# Patient Record
Sex: Male | Born: 2000 | Race: White | Hispanic: No | Marital: Single | Smoking: Never smoker
Health system: Southern US, Community
[De-identification: ages and names within clinical notes are randomized; demographics above are authoritative.]

## PROBLEM LIST (undated history)

## (undated) DIAGNOSIS — F329 Major depressive disorder, single episode, unspecified: Secondary | ICD-10-CM

## (undated) DIAGNOSIS — G4730 Sleep apnea, unspecified: Secondary | ICD-10-CM

## (undated) DIAGNOSIS — F909 Attention-deficit hyperactivity disorder, unspecified type: Secondary | ICD-10-CM

## (undated) DIAGNOSIS — F32A Depression, unspecified: Secondary | ICD-10-CM

## (undated) HISTORY — DX: Depression, unspecified: F32.A

## (undated) HISTORY — PX: TONSILLECTOMY: SUR1361

## (undated) HISTORY — DX: Attention-deficit hyperactivity disorder, unspecified type: F90.9

## (undated) HISTORY — DX: Major depressive disorder, single episode, unspecified: F32.9

## (undated) HISTORY — DX: Sleep apnea, unspecified: G47.30

## (undated) HISTORY — PX: TYMPANOSTOMY TUBE PLACEMENT: SHX32

## (undated) HISTORY — PX: ADENOIDECTOMY: SUR15

---
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
## 2019-01-14 ENCOUNTER — Telehealth: Payer: Self-pay | Admitting: Pediatrics

## 2019-01-15 ENCOUNTER — Telehealth: Payer: Self-pay | Admitting: Pediatrics

## 2021-01-15 ENCOUNTER — Other Ambulatory Visit: Payer: Self-pay

## 2021-01-15 ENCOUNTER — Encounter (HOSPITAL_COMMUNITY): Payer: Self-pay

## 2021-01-15 ENCOUNTER — Emergency Department (HOSPITAL_COMMUNITY): Payer: Self-pay

## 2021-01-15 ENCOUNTER — Emergency Department (HOSPITAL_COMMUNITY)
Admission: EM | Admit: 2021-01-15 | Discharge: 2021-01-15 | Disposition: A | Discharge status: Home / Self Care | Payer: Self-pay | Attending: Emergency Medicine | Admitting: Emergency Medicine

## 2021-01-15 DIAGNOSIS — K6389 Other specified diseases of intestine: Secondary | ICD-10-CM

## 2021-01-15 DIAGNOSIS — K659 Peritonitis, unspecified: Secondary | ICD-10-CM | POA: Insufficient documentation

## 2021-01-15 DIAGNOSIS — R109 Unspecified abdominal pain: Secondary | ICD-10-CM | POA: Diagnosis not present

## 2021-01-15 LAB — COMPREHENSIVE METABOLIC PANEL
ALT: 42 U/L (ref 0–44)
AST: 36 U/L (ref 15–41)
Albumin: 4.5 g/dL (ref 3.5–5.0)
Alkaline Phosphatase: 89 U/L (ref 38–126)
Anion gap: 12 (ref 5–15)
BUN: 12 mg/dL (ref 6–20)
CO2: 24 mmol/L (ref 22–32)
Calcium: 9.2 mg/dL (ref 8.9–10.3)
Chloride: 101 mmol/L (ref 98–111)
Creatinine, Ser: 1.06 mg/dL (ref 0.61–1.24)
GFR, Estimated: >60 mL/min (ref >60)
Glucose, Bld: 90 mg/dL (ref 70–99)
Potassium: 3.6 mmol/L (ref 3.5–5.1)
Sodium: 137 mmol/L (ref 135–145)
Total Bilirubin: 0.7 mg/dL (ref 0.3–1.2)
Total Protein: 8.3 g/dL — ABNORMAL HIGH (ref 6.5–8.1)

## 2021-01-15 LAB — CBC
HCT: 46.1 % (ref 39.0–52.0)
Hemoglobin: 15.5 g/dL (ref 13.0–17.0)
MCH: 28.8 pg (ref 26.0–34.0)
MCHC: 33.6 g/dL (ref 30.0–36.0)
MCV: 85.5 fL (ref 80.0–100.0)
Platelets: 312 10*3/uL (ref 150–400)
RBC: 5.39 MIL/uL (ref 4.22–5.81)
RDW: 12.9 % (ref 11.5–15.5)
WBC: 10.8 10*3/uL — ABNORMAL HIGH (ref 4.0–10.5)
nRBC: 0 % (ref 0.0–0.2)

## 2021-01-15 LAB — URINALYSIS, ROUTINE W REFLEX MICROSCOPIC
Bilirubin Urine: NEGATIVE
Glucose, UA: NEGATIVE mg/dL
Hgb urine dipstick: NEGATIVE
Ketones, ur: NEGATIVE mg/dL
Leukocytes,Ua: NEGATIVE
Nitrite: NEGATIVE
Protein, ur: NEGATIVE mg/dL
Specific Gravity, Urine: 1.017 (ref 1.005–1.030)
pH: 5 (ref 5.0–8.0)

## 2021-01-15 LAB — LIPASE, BLOOD: Lipase: 26 U/L (ref 11–51)

## 2021-01-15 MED ORDER — MORPHINE SULFATE (PF) 4 MG/ML IV SOLN
4.0000 mg | Freq: Once | INTRAVENOUS | Status: AC
  Administered 2021-01-15: 4 mg via INTRAVENOUS
  Filled 2021-01-15: qty 1

## 2021-01-15 MED ORDER — IOHEXOL 300 MG/ML  SOLN
100.0000 mL | Freq: Once | INTRAMUSCULAR | Status: AC | PRN
  Administered 2021-01-15: 100 mL via INTRAVENOUS

## 2021-01-15 MED ORDER — ONDANSETRON HCL 4 MG/2ML IJ SOLN
4.0000 mg | Freq: Once | INTRAMUSCULAR | Status: AC
  Administered 2021-01-15: 4 mg via INTRAVENOUS
  Filled 2021-01-15: qty 2

## 2021-01-15 MED ORDER — SODIUM CHLORIDE 0.9 % IV BOLUS
1000.0000 mL | Freq: Once | INTRAVENOUS | Status: AC
  Administered 2021-01-15: 1000 mL via INTRAVENOUS

## 2021-01-15 MED ORDER — IBUPROFEN 800 MG PO TABS
800.0000 mg | ORAL_TABLET | Freq: Three times a day (TID) | ORAL | 0 refills | Status: DC
Start: 2021-01-15 — End: 2021-11-17

## 2021-01-15 NOTE — Discharge Instructions (Signed)
Your CT scan showed findings consistent with epiploic appendagitis. Please see attached pages for additional information on this condition. Pick up the prescription for Ibuprofen and take as needed every 8 hours for pain. This condition is self limiting and should go away within the next few days.   Please follow up with [facility] for primary care needs  Return to the ED IMMEDIATELY for any new/worsening symptoms including worsening pain, fevers > 100.4, vomiting, or any other concerning symptoms.

## 2021-01-15 NOTE — ED Provider Notes (Signed)
Emergency Medicine Provider Triage Evaluation Note  Robert Cantu , a 20 y.o. male  was evaluated in triage.  Pt complains of gradual onset, constant, worsening, sharp, RLQ abdominal pain for the past 4 days. Pt also complains of nausea and lack of appetite. He mentions that he thought his pain was related to constipation so he took Miralax and Dulcolax and is now having diarrhea. Positive chills; no fever. No previous abdominal surgeries. Last ate around 12-1 PM today. No urinary symptoms.   Review of Systems  Positive: + chills, abdominal pain, nausea, diarrhea Negative: - fevers, urinary symptoms  Physical Exam  BP (!) 143/96 (BP Location: Left Arm)   Pulse (!) 108   Temp 98.4 F (36.9 C) (Oral)   Resp 18   SpO2 98%  Gen:   Awake, no distress   Resp:  Normal effort  MSK:   Moves extremities without difficulty  Other:  + RUQ and RLQ abdominal TTP (worse in the RLQ with positive McBurney's)  Medical Decision Making  Medically screening exam initiated at 6:39 PM.  Appropriate orders placed.  Robert Cantu. was informed that the remainder of the evaluation will be completed by another provider, this initial triage assessment does not replace that evaluation, and the importance of remaining in the ED until their evaluation is complete.  Concern for appendicitis at this time. Work up initiated.    Tanda Rockers, PA-C 01/15/21 1840    Cheryll Cockayne, MD 01/15/21 2231

## 2021-01-15 NOTE — ED Triage Notes (Signed)
Pt reports RLQ x3 days and left shoulder blade pain x1 days. Pt denies N/V/D.

## 2021-01-15 NOTE — ED Provider Notes (Signed)
[facility] Provider Note   CSN: [id number] Arrival date & time: 01/15/21  1817     History Chief Complaint  Patient presents with  . Abdominal Pain    Robert Cantu. is a 20 y.o. male who presents to the ED today with complaint of gradual onset, constant, worsening, RLQ abdominal pain that began 4 days ago. Pt also complains of nausea and lack of appetite however this seems to resolve once he eats. He thought his symptoms were related to constipation and so he took Miralax and dulcolax and is now having diarrhea. Pt also has been having chills, no fever. His mom is a respiratory therapist and was concerned for either gallstones or appendicitis given location of his pain. No previous abdominal surgeries in the past. Denies chest pain, SOB, body aches, testicular pain, testicular swelling, urinary symptoms, or any other associated symptoms.   The history is provided by the patient and medical records.       Past Medical History:  Diagnosis Date  . ADHD (attention deficit hyperactivity disorder)   . Depressed     There are no problems to display for this patient.   Past Surgical History:  Procedure Laterality Date  . ADENOIDECTOMY    . TONSILLECTOMY    . TYMPANOSTOMY TUBE PLACEMENT         Family History  Problem Relation Age of Onset  . Asthma Mother   . Depression Mother   . ADD / ADHD Father   . Hypertension Father     Social History   Tobacco Use  . Smoking status: Never Smoker  . Smokeless tobacco: Never Used  Vaping Use  . Vaping Use: Never used  Substance Use Topics  . Alcohol use: No  . Drug use: No    Home Medications Prior to Admission medications   Medication Sig Start Date End Date Taking? Authorizing Provider  ibuprofen (ADVIL) 800 MG tablet Take 1 tablet (800 mg total) by mouth 3 (three) times daily. 01/15/21  Yes Hyman Hopes, Ilena Dieckman, PA-C  azithromycin (ZITHROMAX Z-PAK) 250 MG tablet Take as directed 06/26/17    Remus Loffler, PA-C  magic mouthwash SOLN Take 5 mLs by mouth 3 (three) times daily as needed for mouth pain. 06/30/17   Mardella Layman, MD    Allergies    Patient has no known allergies.  Review of Systems   Review of Systems  Constitutional: Positive for chills. Negative for fever.  Respiratory: Negative for shortness of breath.   Cardiovascular: Negative for chest pain.  Gastrointestinal: Positive for abdominal pain, diarrhea and nausea. Negative for vomiting.  Genitourinary: Negative for dysuria, frequency and testicular pain.  All other systems reviewed and are negative.   Physical Exam Updated Vital Signs BP 124/68   Pulse 80   Temp 98.4 F (36.9 C) (Oral)   Resp 18   SpO2 96%   Physical Exam Vitals and nursing note reviewed.  Constitutional:      Appearance: He is obese. He is not ill-appearing or diaphoretic.  HENT:     Head: Normocephalic and atraumatic.  Eyes:     Conjunctiva/sclera: Conjunctivae normal.  Cardiovascular:     Rate and Rhythm: Normal rate and regular rhythm.     Heart sounds: Normal heart sounds.  Pulmonary:     Effort: Pulmonary effort is normal.     Breath sounds: Normal breath sounds. No wheezing, rhonchi or rales.  Abdominal:     Palpations: Abdomen is soft.  Tenderness: There is abdominal tenderness in the right upper quadrant and right lower quadrant. There is no right CVA tenderness, left CVA tenderness or guarding. Positive signs include McBurney's sign. Negative signs include Murphy's sign and Rovsing's sign.  Musculoskeletal:     Cervical back: Neck supple.  Skin:    General: Skin is warm and dry.  Neurological:     Mental Status: He is alert.     ED Results / Procedures / Treatments   Labs (all labs ordered are listed, but only abnormal results are displayed) Labs Reviewed  COMPREHENSIVE METABOLIC PANEL - Abnormal; Notable for the following components:      Result Value   Total Protein 8.3 (*)    All other components  within normal limits  CBC - Abnormal; Notable for the following components:   WBC 10.8 (*)    All other components within normal limits  LIPASE, BLOOD  URINALYSIS, ROUTINE W REFLEX MICROSCOPIC    EKG None  Radiology CT Abdomen Pelvis W Contrast  Result Date: 01/15/2021 CLINICAL DATA:  20 year old male with right lower quadrant abdominal pain. EXAM: CT ABDOMEN AND PELVIS WITH CONTRAST TECHNIQUE: Multidetector CT imaging of the abdomen and pelvis was performed using the standard protocol following bolus administration of intravenous contrast. CONTRAST:  OMNIPAQUE IOHEXOL 300 MG/ML  SOLN COMPARISON:  None. FINDINGS: Lower chest: The visualized lung bases are clear. No intra-abdominal free air.  Small free fluid in the pelvis. Hepatobiliary: No focal liver abnormality is seen. No gallstones, gallbladder wall thickening, or biliary dilatation. Pancreas: Unremarkable. No pancreatic ductal dilatation or surrounding inflammatory changes. Spleen: Normal in size without focal abnormality. Adrenals/Urinary Tract: Adrenal glands are unremarkable. Kidneys are normal, without renal calculi, focal lesion, or hydronephrosis. Bladder is unremarkable. Stomach/Bowel: There is no bowel obstruction. The appendix is normal. There is focal inflammatory changes of the fat adjacent to the proximal ascending colon in the right lower quadrant consistent with epiploic appendagitis. No fluid collection or abscess. Vascular/Lymphatic: No significant vascular findings are present. No enlarged abdominal or pelvic lymph nodes. Reproductive: The prostate and seminal vesicles are grossly unremarkable. No pelvic mass. Other: None Musculoskeletal: No acute or significant osseous findings. IMPRESSION: 1. Epiploic appendagitis in the right lower quadrant. No abscess. 2. No bowel obstruction. Normal appendix. Electronically Signed   By: Elgie Collard M.D.   On: 01/15/2021 21:25    Procedures Procedures   Medications Ordered in  ED Medications  sodium chloride 0.9 % bolus 1,000 mL (1,000 mLs Intravenous New Bag/Given 01/15/21 2020)  ondansetron (ZOFRAN) injection 4 mg (4 mg Intravenous Given 01/15/21 2020)  morphine 4 MG/ML injection 4 mg (4 mg Intravenous Given 01/15/21 2020)  iohexol (OMNIPAQUE) 300 MG/ML solution 100 mL (100 mLs Intravenous Contrast Given 01/15/21 2056)    ED Course  I have reviewed the triage vital signs and the nursing notes.  Pertinent labs & imaging results that were available during my care of the patient were reviewed by me and considered in my medical decision making (see chart for details).    MDM Rules/Calculators/A&P                          20 year old male who presents to the ED today with complaint of persistent right lower quadrant abdominal pain for the past 4 days with associated nausea.  Having diarrhea however he thinks it is related to taking MiraLAX and Dulcolax earlier as he thought his symptoms were related to constipation.  No  previous abdominal surgeries.  On arrival to the ED patient is afebrile.  He is mildly tachycardic at 108.  Nontachypneic.  Nontoxic-appearing.  On my exam patient has right upper quadrant and right lower quadrant abdominal tenderness palpation however worse in the right lower quadrant with positive McBurney sign.  Concern for possible appendicitis at this time.  Work-up was initiated by myself in triage with lab work.  CBC with very mild leukocytosis 10,800.  Hemoglobin stable at 15.5.  CMP without electrolyte abnormalities.  Creatinine stable at 1.06.  No LFT elevation.  Lipase 26.  Provided with pain medication, fluids, antiemetics.  Will need CT scan to assess for appendicitis.   CT scan: IMPRESSION:  1. Epiploic appendagitis in the right lower quadrant. No abscess.  2. No bowel obstruction. Normal appendix.   On reevaluation pt resting comfortably. Reports improvement in pain. Tolerating PO here without difficulty. Will discharge home at this time. Pt  instructed on NSAIDs for pain and PCP follow up. Will provide info for [facility] for primary care needs. Pt instructed to return to the ED for any new/worsening symptoms. He is in agreement with plan and stable for discharge home.   This note was prepared using Dragon voice recognition software and may include unintentional dictation errors due to the inherent limitations of voice recognition software.    Final Clinical Impression(s) / ED Diagnoses Final diagnoses:  Epiploic appendagitis    Rx / DC Orders ED Discharge Orders         Ordered    ibuprofen (ADVIL) 800 MG tablet  3 times daily        01/15/21 2139           Discharge Instructions     Your CT scan showed findings consistent with epiploic appendagitis. Please see attached pages for additional information on this condition. Pick up the prescription for Ibuprofen and take as needed every 8 hours for pain. This condition is self limiting and should go away within the next few days.   Please follow up with [facility] for primary care needs  Return to the ED IMMEDIATELY for any new/worsening symptoms including worsening pain, fevers > 100.4, vomiting, or any other concerning symptoms.        Tanda Rockers, PA-C 01/15/21 2141    Cheryll Cockayne, MD 01/15/21 2208

## 2021-11-17 ENCOUNTER — Ambulatory Visit (INDEPENDENT_AMBULATORY_CARE_PROVIDER_SITE_OTHER): Payer: PRIVATE HEALTH INSURANCE | Admitting: Family Medicine

## 2021-11-17 ENCOUNTER — Encounter: Payer: Self-pay | Admitting: Family Medicine

## 2021-11-17 VITALS — BP 152/68 | HR 94 | Temp 98.3°F | Ht 72.0 in | Wt 378.0 lb

## 2021-11-17 DIAGNOSIS — R0683 Snoring: Secondary | ICD-10-CM

## 2021-11-17 DIAGNOSIS — R030 Elevated blood-pressure reading, without diagnosis of hypertension: Secondary | ICD-10-CM

## 2021-11-17 DIAGNOSIS — B372 Candidiasis of skin and nail: Secondary | ICD-10-CM

## 2021-11-17 MED ORDER — FLUCONAZOLE 100 MG PO TABS: ORAL_TABLET | ORAL | 0 refills | Status: DC

## 2021-11-17 NOTE — Progress Notes (Signed)
? ?Subjective:  ?Patient ID: Robert Leavens., male    DOB: 11-Sep-2001  Age: 21 y.o. MRN: [id number] ? ?CC: Establish Care ? ? ?HPI ?Robert Leavens. presents for possible sleep apnea. Snores a lot. Sleep is restful. Not excessively tired through the day. Has gained 160 lb in the last 5 years. Not exercising. Says his diet is okay. BP elevated before. NEver dx with HTN. Has rash left axilla. Treating with OTC antifungals. ? ?Depression screen [facility] 2/9 11/17/2021 06/26/2017  ?Decreased Interest 0 0  ?Down, Depressed, Hopeless 0 0  ?PHQ - 2 Score 0 0  ?Altered sleeping - 0  ?Tired, decreased energy - 0  ?Change in appetite - 0  ?Feeling bad or failure about yourself  - 0  ?Trouble concentrating - 0  ?Moving slowly or fidgety/restless - 0  ?Suicidal thoughts - 0  ?PHQ-9 Score - 0  ? ? ?History ?Robert Cantu has a past medical history of ADHD (attention deficit hyperactivity disorder) and Depressed.  ? ?He has a past surgical history that includes Adenoidectomy; Tonsillectomy; and Tympanostomy tube placement.  ? ?His family history includes ADD / ADHD in his father; Asthma in his mother; Cancer in his mother; Depression in his mother; Diabetes in his father and mother; Heart disease in his father; Hypertension in his father.He reports that he has never smoked. He has never used smokeless tobacco. He reports that he does not drink alcohol and does not use drugs. ? ? ? ?ROS ?Review of Systems  ?Constitutional:  Negative for fever.  ?Respiratory:  Negative for shortness of breath.   ?Cardiovascular:  Negative for chest pain.  ?Musculoskeletal:  Negative for arthralgias.  ?Skin:  Negative for rash.  ? ?Objective:  ?BP (!) 152/68   Pulse 94   Temp 98.3 ?F (36.8 ?C)   Ht 6' (1.829 m)   Wt (!) 378 lb (171.5 kg)   SpO2 97%   BMI 51.27 kg/m?  ? ?BP Readings from Last 3 Encounters:  ?11/17/21 (!) 152/68  ?01/15/21 (!) 132/97  ?06/30/17 125/72 (68 %, Z = 0.47 /  58 %, Z = 0.20)*  ? ?*BP percentiles are based on the 2017 AAP Clinical  Practice Guideline for boys  ? ? ?Wt Readings from Last 3 Encounters:  ?11/17/21 (!) 378 lb (171.5 kg)  ?06/26/17 217 lb 12.8 oz (98.8 kg) (98 %, Z= 2.08)*  ?05/12/15 226 lb (102.5 kg) (>99 %, Z= 2.72)*  ? ?* Growth percentiles are based on CDC (Boys, 2-20 Years) data.  ? ? ? ?Physical Exam ?Constitutional:   ?   General: He is not in acute distress. ?   Appearance: He is well-developed. He is obese.  ?HENT:  ?   Head: Normocephalic and atraumatic.  ?   Right Ear: External ear normal.  ?   Left Ear: External ear normal.  ?   Nose: Nose normal.  ?Eyes:  ?   Conjunctiva/sclera: Conjunctivae normal.  ?   Pupils: Pupils are equal, round, and reactive to light.  ?Cardiovascular:  ?   Rate and Rhythm: Normal rate and regular rhythm.  ?   Heart sounds: Normal heart sounds. No murmur heard. ?Pulmonary:  ?   Effort: Pulmonary effort is normal. No respiratory distress.  ?   Breath sounds: Normal breath sounds. No wheezing or rales.  ?Abdominal:  ?   Palpations: Abdomen is soft.  ?   Tenderness: There is no abdominal tenderness.  ?Musculoskeletal:     ?   General:  Normal range of motion.  ?   Cervical back: Normal range of motion and neck supple.  ?Skin: ?   General: Skin is warm and dry.  ?   Findings: Rash (erythema with satelites, clear center surrounding the entire left axilla.) present.  ?Neurological:  ?   Mental Status: He is alert and oriented to person, place, and time.  ?   Deep Tendon Reflexes: Reflexes are normal and symmetric.  ?Psychiatric:     ?   Behavior: Behavior normal.     ?   Thought Content: Thought content normal.     ?   Judgment: Judgment normal.  ? ? ? ? ?Assessment & Plan:  ? ?[name] was seen today for establish care. ? ?Diagnoses and all orders for this visit: ? ?Elevated blood pressure reading ? ?Morbid obesity (HCC) ? ?Snores ?-     Ambulatory referral to Sleep Studies ? ?Candidal intertrigo ?-     fluconazole (DIFLUCAN) 100 MG tablet; Take two with first dose. Then starting the next day take one  daily until all are taken. ? ? ? ? ? ? ?I have discontinued [name]'s azithromycin, magic mouthwash, and ibuprofen. I am also having him start on fluconazole. ? ?Allergies as of 11/17/2021   ?No Known Allergies ?  ? ?  ?Medication List  ?  ? ?  ? Accurate as of November 17, 2021  1:51 PM. If you have any questions, ask your nurse or doctor.  ?  ?  ? ?  ? ?STOP taking these medications   ? ?azithromycin 250 MG tablet ?Commonly known as: Zithromax Z-Pak ?Stopped by: Mechele Claude, MD ?  ?ibuprofen 800 MG tablet ?Commonly known as: ADVIL ?Stopped by: Mechele Claude, MD ?  ?magic mouthwash Soln ?Stopped by: Mechele Claude, MD ?  ? ?  ? ?TAKE these medications   ? ?fluconazole 100 MG tablet ?Commonly known as: Diflucan ?Take two with first dose. Then starting the next day take one daily until all are taken. ?Started by: Mechele Claude, MD ?  ? ?  ? ? ? ?Follow-up: Return in about 1 month (around 12/18/2021) for blood pressure. ? ?Mechele Claude, M.D. ?

## 2021-11-17 NOTE — Patient Instructions (Signed)
DASH Eating Plan °DASH stands for Dietary Approaches to Stop Hypertension. The DASH eating plan is a healthy eating plan that has been shown to: °Reduce high blood pressure (hypertension). °Reduce your risk for type 2 diabetes, heart disease, and stroke. °Help with weight loss. °What are tips for following this plan? °Reading food labels °Check food labels for the amount of salt (sodium) per serving. Choose foods with less than 5 percent of the Daily Value of sodium. Generally, foods with less than 300 milligrams (mg) of sodium per serving fit into this eating plan. °To find whole grains, look for the word "whole" as the first word in the ingredient list. °Shopping °Buy products labeled as "low-sodium" or "no salt added." °Buy fresh foods. Avoid canned foods and pre-made or frozen meals. °Cooking °Avoid adding salt when cooking. Use salt-free seasonings or herbs instead of table salt or sea salt. Check with your health care provider or pharmacist before using salt substitutes. °Do not fry foods. Cook foods using healthy methods such as baking, boiling, grilling, roasting, and broiling instead. °Cook with heart-healthy oils, such as olive, canola, avocado, soybean, or sunflower oil. °Meal planning ° °Eat a balanced diet that includes: °4 or more servings of fruits and 4 or more servings of vegetables each day. Try to fill one-half of your plate with fruits and vegetables. °6-8 servings of whole grains each day. °Less than 6 oz (170 g) of lean meat, poultry, or fish each day. A 3-oz (85-g) serving of meat is about the same size as a deck of cards. One egg equals 1 oz (28 g). °2-3 servings of low-fat dairy each day. One serving is 1 cup (237 mL). °1 serving of nuts, seeds, or beans 5 times each week. °2-3 servings of heart-healthy fats. Healthy fats called omega-3 fatty acids are found in foods such as walnuts, flaxseeds, fortified milks, and eggs. These fats are also found in cold-water fish, such as sardines, salmon,  and mackerel. °Limit how much you eat of: °Canned or prepackaged foods. °Food that is high in trans fat, such as some fried foods. °Food that is high in saturated fat, such as fatty meat. °Desserts and other sweets, sugary drinks, and other foods with added sugar. °Full-fat dairy products. °Do not salt foods before eating. °Do not eat more than 4 egg yolks a week. °Try to eat at least 2 vegetarian meals a week. °Eat more home-cooked food and less restaurant, buffet, and fast food. °Lifestyle °When eating at a restaurant, ask that your food be prepared with less salt or no salt, if possible. °If you drink alcohol: °Limit how much you use to: °0-1 drink a day for women who are not pregnant. °0-2 drinks a day for men. °Be aware of how much alcohol is in your drink. In the [place] one drink equals one 12 oz bottle of beer (355 mL), one 5 oz glass of wine (148 mL), or one 1½ oz glass of hard liquor (44 mL). °General information °Avoid eating more than 2,300 mg of salt a day. If you have hypertension, you may need to reduce your sodium intake to 1,500 mg a day. °Work with your health care provider to maintain a healthy body weight or to lose weight. Ask what an ideal weight is for you. °Get at least 30 minutes of exercise that causes your heart to beat faster (aerobic exercise) most days of the week. Activities may include walking, swimming, or biking. °Work with your health care provider or dietitian to   adjust your eating plan to your individual calorie needs. °What foods should I eat? °Fruits °All fresh, dried, or frozen fruit. Canned fruit in natural juice (without added sugar). °Vegetables °Fresh or frozen vegetables (raw, steamed, roasted, or grilled). Low-sodium or reduced-sodium tomato and vegetable juice. Low-sodium or reduced-sodium tomato sauce and tomato paste. Low-sodium or reduced-sodium canned vegetables. °Grains °Whole-grain or whole-wheat bread. Whole-grain or whole-wheat pasta. Brown rice. Oatmeal. Quinoa.  Bulgur. Whole-grain and low-sodium cereals. Pita bread. Low-fat, low-sodium crackers. Whole-wheat flour tortillas. °Meats and other proteins °Skinless chicken or turkey. Ground chicken or turkey. Pork with fat trimmed off. Fish and seafood. Egg whites. Dried beans, peas, or lentils. Unsalted nuts, nut butters, and seeds. Unsalted canned beans. Lean cuts of beef with fat trimmed off. Low-sodium, lean precooked or cured meat, such as sausages or meat loaves. °Dairy °Low-fat (1%) or fat-free (skim) milk. Reduced-fat, low-fat, or fat-free cheeses. Nonfat, low-sodium ricotta or cottage cheese. Low-fat or nonfat yogurt. Low-fat, low-sodium cheese. °Fats and oils °Soft margarine without trans fats. Vegetable oil. Reduced-fat, low-fat, or light mayonnaise and salad dressings (reduced-sodium). Canola, safflower, olive, avocado, soybean, and sunflower oils. Avocado. °Seasonings and condiments °Herbs. Spices. Seasoning mixes without salt. °Other foods °Unsalted popcorn and pretzels. Fat-free sweets. °The items listed above may not be a complete list of foods and beverages you can eat. Contact a dietitian for more information. °What foods should I avoid? °Fruits °Canned fruit in a light or heavy syrup. Fried fruit. Fruit in cream or butter sauce. °Vegetables °Creamed or fried vegetables. Vegetables in a cheese sauce. Regular canned vegetables (not low-sodium or reduced-sodium). Regular canned tomato sauce and paste (not low-sodium or reduced-sodium). Regular tomato and vegetable juice (not low-sodium or reduced-sodium). Pickles. Olives. °Grains °Baked goods made with fat, such as croissants, muffins, or some breads. Dry pasta or rice meal packs. °Meats and other proteins °Fatty cuts of meat. Ribs. Fried meat. Bacon. Bologna, salami, and other precooked or cured meats, such as sausages or meat loaves. Fat from the back of a pig (fatback). Bratwurst. Salted nuts and seeds. Canned beans with added salt. Canned or smoked fish.  Whole eggs or egg yolks. Chicken or turkey with skin. °Dairy °Whole or 2% milk, cream, and half-and-half. Whole or full-fat cream cheese. Whole-fat or sweetened yogurt. Full-fat cheese. Nondairy creamers. Whipped toppings. Processed cheese and cheese spreads. °Fats and oils °Butter. Stick margarine. Lard. Shortening. Ghee. Bacon fat. Tropical oils, such as coconut, palm kernel, or palm oil. °Seasonings and condiments °Onion salt, garlic salt, seasoned salt, table salt, and sea salt. Worcestershire sauce. Tartar sauce. Barbecue sauce. Teriyaki sauce. Soy sauce, including reduced-sodium. Steak sauce. Canned and packaged gravies. Fish sauce. Oyster sauce. Cocktail sauce. Store-bought horseradish. Ketchup. Mustard. Meat flavorings and tenderizers. Bouillon cubes. Hot sauces. Pre-made or packaged marinades. Pre-made or packaged taco seasonings. Relishes. Regular salad dressings. °Other foods °Salted popcorn and pretzels. °The items listed above may not be a complete list of foods and beverages you should avoid. Contact a dietitian for more information. °Where to find more information °National Heart, Lung, and Blood Institute: www.nhlbi.nih.gov °American Heart Association: www.heart.org °Academy of Nutrition and Dietetics: www.eatright.org °National Kidney Foundation: www.kidney.org °Summary °The DASH eating plan is a healthy eating plan that has been shown to reduce high blood pressure (hypertension). It may also reduce your risk for type 2 diabetes, heart disease, and stroke. °When on the DASH eating plan, aim to eat more fresh fruits and vegetables, whole grains, lean proteins, low-fat dairy, and heart-healthy fats. °With the DASH   eating plan, you should limit salt (sodium) intake to 2,300 mg a day. If you have hypertension, you may need to reduce your sodium intake to 1,500 mg a day. °Work with your health care provider or dietitian to adjust your eating plan to your individual calorie needs. °This information is not  intended to replace advice given to you by your health care provider. Make sure you discuss any questions you have with your health care provider. °Document Revised: 08/01/2019 Document Reviewed: 08/01/2019 °Elsevier Patient Education © 2022 [employer] Inc. ° °

## 2022-01-02 ENCOUNTER — Encounter: Payer: Self-pay | Admitting: Family Medicine

## 2022-01-02 ENCOUNTER — Ambulatory Visit (INDEPENDENT_AMBULATORY_CARE_PROVIDER_SITE_OTHER): Payer: PRIVATE HEALTH INSURANCE | Admitting: Family Medicine

## 2022-01-02 DIAGNOSIS — R0683 Snoring: Secondary | ICD-10-CM | POA: Diagnosis not present

## 2022-01-02 NOTE — Progress Notes (Signed)
? ?Subjective:  ?Patient ID: Robert Cantu., male    DOB: [birth year]  Age: 21 y.o. MRN: [id number] ? ?CC: No chief complaint on file. ? ? ?HPI ?Robert Cantu. presents for recheck of BP. Adopting a low stress mental outlook Some decrease is salt in the diet. DCed fast food. Concerned about heart health if he remains obese. ? ?Reports he has tried to contact the [facility], but has not gotten an answer. He denies having had any missed calls. He does not use MyChart. I reviewed the referral work and it was noted that pt. Could not be reached.  ? ? ?  01/02/2022  ?  2:58 PM 11/17/2021  ? 10:45 AM 06/26/2017  ?  5:57 PM  ?Depression screen PHQ 2/9  ?Decreased Interest 0 0 0  ?Down, Depressed, Hopeless 0 0 0  ?PHQ - 2 Score 0 0 0  ?Altered sleeping   0  ?Tired, decreased energy   0  ?Change in appetite   0  ?Feeling bad or failure about yourself    0  ?Trouble concentrating   0  ?Moving slowly or fidgety/restless   0  ?Suicidal thoughts   0  ?PHQ-9 Score   0  ? ? ?History ?[name] has a past medical history of ADHD (attention deficit hyperactivity disorder) and Depressed.  ? ?He has a past surgical history that includes Adenoidectomy; Tonsillectomy; and Tympanostomy tube placement.  ? ?His family history includes ADD / ADHD in his father; Asthma in his mother; Cancer in his mother; Depression in his mother; Diabetes in his father and mother; Heart disease in his father; Hypertension in his father.He reports that he has never smoked. He has never used smokeless tobacco. He reports that he does not drink alcohol and does not use drugs. ? ? ? ?ROS ?Review of Systems  ?Constitutional:  Negative for fever.  ?Respiratory:  Negative for shortness of breath.   ?Cardiovascular:  Negative for chest pain.  ?Musculoskeletal:  Negative for arthralgias.  ?Skin:  Negative for rash.  ? ?Objective:  ?BP 130/67   Pulse 83   Temp 98 ?F (36.7 ?C)   Ht 6' (1.829 m)   Wt (!) 367 lb 12.8 oz (166.8 kg)   SpO2 98%   BMI 49.88 kg/m?   ? ?BP Readings from Last 3 Encounters:  ?01/02/22 130/67  ?11/17/21 (!) 152/68  ?01/15/21 (!) 132/97  ? ? ?Wt Readings from Last 3 Encounters:  ?01/02/22 (!) 367 lb 12.8 oz (166.8 kg)  ?11/17/21 (!) 378 lb (171.5 kg)  ?06/26/17 217 lb 12.8 oz (98.8 kg) (98 %, Z= 2.08)*  ? ?* Growth percentiles are based on CDC (Boys, 2-20 Years) data.  ? ? ? ?Physical Exam ?Vitals reviewed.  ?Constitutional:   ?   Appearance: He is well-developed.  ?HENT:  ?   Head: Normocephalic and atraumatic.  ?   Right Ear: External ear normal.  ?   Left Ear: External ear normal.  ?   Mouth/Throat:  ?   Pharynx: No oropharyngeal exudate or posterior oropharyngeal erythema.  ?Eyes:  ?   Pupils: Pupils are equal, round, and reactive to light.  ?Cardiovascular:  ?   Rate and Rhythm: Normal rate and regular rhythm.  ?   Heart sounds: No murmur heard. ?Pulmonary:  ?   Effort: No respiratory distress.  ?   Breath sounds: Normal breath sounds.  ?Musculoskeletal:  ?   Cervical back: Normal range of motion and neck supple.  ?Neurological:  ?  Mental Status: He is alert and oriented to person, place, and time.  ? ? ? ? ?Assessment & Plan:  ? ?Diagnoses and all orders for this visit: ? ?Morbid obesity ([place]) ?-     Amb Ref to Medical Weight Management ? ?Snores ? ? ? ? ? ? ?I have discontinued Lyman Speller Jr.'s fluconazole. ? ?Allergies as of 01/02/2022   ?No Known Allergies ?  ? ?  ?Medication List  ?  ? ?  ? Accurate as of January 02, 2022  4:02 PM. If you have any questions, ask your nurse or doctor.  ?  ?  ? ?  ? ?STOP taking these medications   ? ?fluconazole 100 MG tablet ?Commonly known as: Diflucan ?Stopped by: Claretta Fraise, MD ?  ? ?  ? ? ? ?Follow-up: Return in about 3 months (around 04/03/2022). ? ?Claretta Fraise, M.D. ?

## 2022-03-02 ENCOUNTER — Ambulatory Visit (INDEPENDENT_AMBULATORY_CARE_PROVIDER_SITE_OTHER): Payer: PRIVATE HEALTH INSURANCE | Admitting: Neurology

## 2022-03-02 ENCOUNTER — Encounter: Payer: Self-pay | Admitting: Neurology

## 2022-03-02 VITALS — BP 145/86 | HR 93 | Ht 72.0 in | Wt 369.0 lb

## 2022-03-02 DIAGNOSIS — Z024 Encounter for examination for driving license: Secondary | ICD-10-CM

## 2022-03-02 DIAGNOSIS — Z6841 Body Mass Index (BMI) 40.0 and over, adult: Secondary | ICD-10-CM | POA: Diagnosis not present

## 2022-03-02 DIAGNOSIS — R0683 Snoring: Secondary | ICD-10-CM

## 2022-03-02 DIAGNOSIS — E66813 Obesity, class 3: Secondary | ICD-10-CM

## 2022-03-02 NOTE — Patient Instructions (Signed)
Preventing Consequences of Unhealthy Weight Loss Behaviors, Adult Reaching and maintaining a healthy weight is important for your overall health. A healthy weight will vary from person to person. It is natural to want to lose weight quickly, using whatever methods seem fastest. However, losing weight in a healthy way is not a quick process. Instead, aim for slow, steady weight loss by making small changes and setting achievable goals. How can unhealthy weight loss behaviors affect me? Using unhealthy behaviors to try to lose weight can cause: Tiredness (fatigue), low heart rate, and low blood pressure. Imbalances in your body. These may be imbalances in: Electrolytes. These are salts and minerals in your blood. Chemicals. These are needed so your body can work properly. Body fluids. Loss of fluids may lead to dehydration. Organ damage or organ failure, especially affecting the kidneys. Thin bones that break easily. Loneliness or relationship problems with your friends and family. Emotional problems, including depression and anxiety. Changing unhealthy weight loss behaviors through lifestyle changes will improve your overall health. Maintaining a healthy weight also lowers your risk of certain conditions, such as: Obesity. Heart disease, high cholesterol, and high blood pressure. Type 2 diabetes. Breathing and sleeping disorders. Stroke. Osteoarthritis. This affects your joints. Osteoporosis. This affects your bones. Some cancers. What can increase my risk? Certain views or feelings about yourself and certain habits can increase your risk of unhealthy weight loss behaviors. These include: Having depression and being overweight as an adult. Attempting weight loss as a child or teen. Using alcohol, drugs, or tobacco products. What actions can I take to prevent these behaviors? You can make certain lifestyle changes to help you lose weight in a healthy way. These include eating nutritious  foods and exercising regularly. Nutrition  Eat a variety of healthy foods, including fruits and vegetables, whole grains, lean proteins, and low-fat dairy products. Drink water instead of sugary drinks such as juice, soda, or sports drinks. Drink enough fluid to keep your urine pale yellow. Plan healthy, balanced meals. Work with a dietitian to make a healthy meal plan that works for you. Limit the following: Foods that are high in fat, salt (sodium), or sugar. These include candy, donuts, pizza, and fast foods. Fried or heavily processed foods. Lifestyle Avoid these unhealthy eating habits: Following a diet that restricts entire types of food. This may be a popular diet that promises extreme results in a short time. Skipping meals to save calories. Not eating anything for long periods of time (fasting). Restricting your calories to far fewer than the number that you need to lose or maintain a healthy weight. Taking laxative pills to make you have more frequent bowel movements. Taking medicines to make your body lose excess fluids (diuretics). Eating an excessive amount of food and then making yourself vomit. This is known as bingeing and purging. Do not use any products that contain nicotine or tobacco. These products include cigarettes, chewing tobacco, and vaping devices, such as e-cigarettes. If you need help quitting, ask your health care provider. Alcohol use Do not drink alcohol if: Your health care provider tells you not to drink. You are pregnant, may be pregnant, or are planning to become pregnant. If you drink alcohol: Limit how much you have to: 0-1 drink a day for women. 0-2 drinks a day for men. Know how much alcohol is in your drink. In the [place] one drink equals one 12 oz bottle of beer (355 mL), one 5 oz glass of wine (148 mL), or one 1   oz glass of hard liquor (44 mL). Activity  Avoid compulsively getting an extreme amount of exercise. Work with a dietitian to make a  healthy exercise program. Include different types of exercise in your exercise program, such as strengthening, aerobic, and flexibility exercises. To maintain your weight, get at least 150 minutes of moderate-intensity exercise each week. Moderate-intensity exercise could be brisk walking or biking. To lose a healthy amount of weight, get 60 minutes of moderate-intensity exercise each day. Find ways to reduce stress, such as regular exercise or meditation. Find a hobby or other activity that you enjoy to distract you from eating when you feel stressed or bored. Where to find support For more support, talk with: Your health care provider or dietitian. Ask about support groups. A mental health care provider. Family and friends. Where to find more information Learn more about how to prevent complications from unhealthy weight loss behaviors from: Centers for Disease Control and Prevention: www.cdc.gov National Institute of Mental Health: www.nimh.nih.gov National Eating Disorders Association: www.nationaleatingdisorders.org Contact a health care provider if: You often feel very tired. You notice changes in your skin or your hair. You faint because of dehydration or too much exercise. You struggle to change your unhealthy weight loss behaviors on your own. Unhealthy weight loss behaviors are affecting your daily life or your relationships. You have signs or symptoms of an eating disorder. You have major weight changes in a short period of time. You feel guilty or ashamed about eating or exercising. Summary Using unhealthy eating behaviors to try to lose weight can cause a variety of physical and emotional problems that affect your overall health and well-being. Aim for slow, steady weight loss by choosing healthy foods, avoiding unhealthy eating habits, and exercising regularly. Contact your health care provider if you struggle to change your behaviors on your own or if you think that you may  have an eating disorder. This information is not intended to replace advice given to you by your health care provider. Make sure you discuss any questions you have with your health care provider. Document Revised: 04/12/2021 Document Reviewed: 04/12/2021 Elsevier Patient Education  2023 [employer] Inc.  

## 2022-03-02 NOTE — Progress Notes (Signed)
SLEEP MEDICINE CLINIC    Provider:  Melvyn Novas, MD  Primary Care Physician:  Mechele Claude, MD [address] [place] [address]     Referring Provider: Mechele Claude, Md [address] [place],  [place] [address]          Chief Complaint according to patient   Patient presents with:     New Patient (Initial Visit)     Presents today per DOT recommendation. He wants to get a license. He overall feels fine in regards to his sleep and feels well rested. 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 He has never had a SS. Has been told he snores in sleep.  Avg 8-10 hrs of sleep and wakes up refreshed. No complaints of daytime sleepiness.      HISTORY OF PRESENT ILLNESS:  Robert Cantu. is a 21 y.o.  Caucasian male patient seen here as a referral on 03/02/2022 from PCP for a DOT prep PSG examination. .  Chief concern according to patient :  I need to get a DOT licence.   I have the pleasure of seeing Robert Cantu. today, a right-handed White or Caucasian male with a possible sleep disorder.  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    Sleep relevant medical history: Nocturia/ Enuresis, Tonsillectomy at age 76, sleep talking- enlarged thyroid, LN neck .   Family medical /sleep history: single child, both parents  with OSA. Grandparent with COPD. 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  The patient currently works for 15 day periods, erratic.  Pets are present: 2 dogs, several cats.  Tobacco use: second hand smoke.  ETOH use ; rarely, Caffeine intake in form of Coffee( 4 a day) Soda( 2-3 day) Tea ( /) some energy drinks-  monsters. . Regular exercise in form of work related activity- lifting, outdoors.       Sleep habits are as follows: The patient's dinner time is between 4-6 PM. The patient goes to bed at 10-12 PM and continues to sleep for 6-8 hours, no bathroom breaks.   The preferred sleep position is sideways, with the support of 2 pillows. Dreams are reportedly frequent/vivid.  8  AM is the usual rise time. The patient wakes up with an alarm.  He reports not feeling refreshed or restored in AM, Naps are taken rare - unless having been at work for 12-16 hours. lReview of Systems: Out of a complete 14 system review, the patient complains of only the following symptoms, and all other reviewed systems are negative.:  Fatigue, sleepiness , snoring, unfragmented sleep- in the truck.    How likely are you to doze in the following situations: 0 = not likely, 1 = slight chance, 2 = moderate chance, 3 = high chance   Sitting and Reading? Watching Television? Sitting inactive in a public place (theater or meeting)? As a passenger in a car for an hour without a break? Lying down in the afternoon when circumstances permit? Sitting and talking to someone? Sitting quietly after lunch without alcohol? In a car, while stopped for a few  minutes in traffic?   Total = 3/ 24 points   FSS endorsed at 18/ 63 points.   Social History   Socioeconomic History   Marital status: Single    Spouse name: Not on file   Number of children: Not on file   Years of education: Not on file   Highest education level: 12th grade  Occupational History   Occupation: Works in Theatre stage manager  Tobacco Use   Smoking status: Never   Smokeless tobacco: Never  Vaping Use   Vaping Use: Never used  Substance and Sexual Activity   Alcohol use: No    Comment: rare   Drug use: No   Sexual activity: Never  Other Topics Concern   Not on file  Social History Narrative   Not on file   Social Determinants of Health   Financial Resource  Strain: Not on file  Food Insecurity: Not on file  Transportation Needs: Not on file  Physical Activity: Not on file  Stress: Not on file  Social Connections: Not on file    Family History  Problem Relation Age of Onset   Cancer Mother    Diabetes Mother    Asthma Mother    Depression Mother    Heart disease Father    Diabetes Father    ADD / ADHD Father    Hypertension Father     Past Medical History:  Diagnosis Date   ADHD (attention deficit hyperactivity disorder)    Depressed  Morbid obesity     Past Surgical History:  Procedure Laterality Date   ADENOIDECTOMY     TONSILLECTOMY     TYMPANOSTOMY TUBE PLACEMENT       Current Outpatient Medications on File Prior to Visit  Medication Sig Dispense Refill   amoxicillin (AMOXIL) 500 MG capsule Take 500 mg by mouth 2 (two) times daily.     No current facility-administered medications on file prior to visit.    No Known Allergies  Physical exam:  Today's Vitals   03/02/22 0957  BP: (!) 145/86  Pulse: 93  Weight: (!) 369 lb (167.4 kg)  Height: 6' (1.829 m)   Body mass index is 50.05 kg/m.   Wt Readings from Last 3 Encounters:  03/02/22 (!) 369 lb (167.4 kg)  01/02/22 (!) 367 lb 12.8 oz (166.8 kg)  11/17/21 (!) 378 lb (171.5 kg)     Ht Readings from Last 3 Encounters:  03/02/22 6' (1.829 m)  01/02/22 6' (1.829 m)  11/17/21 6' (1.829 m)      General: The patient is awake, alert and appears not in acute distress. The patient is well groomed. Head: Normocephalic, atraumatic. Neck is supple. Mallampati ,  neck circumference:19 inches . Nasal airflow  patent.  Retrognathia is seen. Overbite- barces in childhood.  Dental status: crowded Cardiovascular:  Regular rate and cardiac rhythm by pulse,  without distended neck veins. Respiratory: Lungs are clear to auscultation.  Skin:  Without evidence of ankle edema, or rash. Trunk: The patient's posture is erect.   Neurologic exam : The patient is awake and  alert, oriented to place and time.   Memory subjective described as intact.  Attention span & concentration ability appears normal.  Speech is fluent,  without  dysarthria, dysphonia or aphasia.  Mood and affect are appropriate.   Cranial nerves: no loss of smell or taste reported  Pupils are equal and briskly reactive to light. Funduscopic exam deferred..  Extraocular movements in vertical and horizontal planes were intact  and without nystagmus. No Diplopia. Visual fields by finger perimetry are intact. Hearing was intact to soft voice and finger rubbing.  Formerly ear tubes.    Facial sensation intact to fine touch.  Facial motor strength is symmetric and tongue and uvula move midline.  Neck ROM : rotation, tilt and flexion extension were normal for age and shoulder shrug was symmetrical.    Motor exam:  Symmetric bulk, tone and ROM.   Normal tone without cog wheeling, symmetric grip strength .   Sensory:  Fine touch and vibration.  Proprioception tested in the upper extremities was normal.   Coordination: Rapid alternating movements in the fingers/hands were of normal speed.   Normal penmanship.  The Finger-to-nose maneuver was intact without evidence of ataxia, dysmetria or tremor.   Gait and station: Patient could rise unassisted from a seated position, walked without assistive device.  Stance is of base .  Toe and heel walk were deferred.  Deep tendon reflexes: in the  upper and lower extremities are symmetric and intact.  Babinski response was deferred /.       After spending a total time of 45 minutes face to face and additional time for physical and neurologic examination, review of laboratory studies,  personal review of imaging studies, reports and results of other testing and review of referral information / records as far as provided in visit, I have established the following assessments:  1)  BMI is a high risk marker, snoring has been observed.  2)  normal upper  airway but large neck, small jaw.  3) restorative sleep, no fatigue.  4) untreated for ADD/ADHD at this time.    My Plan is to proceed with:  1) HST ASAP for DOT, please mark on records.    I would like to thank Mechele Claude, MD and Mechele Claude, Md [address] [place],  [place] [address] for allowing me to meet with and to take care of this pleasant patient.    I plan to follow up either personally or through our NP within 2-3 months.    Electronically signed by: Melvyn Novas, MD 03/02/2022 10:27 AM  [facility] Board certified by The [employer] of Sleep Medicine and Diplomate of the [employer] of Sleep Medicine. Board certified In Neurology through the ABPN, Fellow of the [employer] of Neurology. Medical Director of [facility].

## 2022-03-21 ENCOUNTER — Telehealth: Payer: Self-pay | Admitting: Neurology

## 2022-03-21 NOTE — Telephone Encounter (Signed)
HST- no auth req it is not a covered benefit with his insurnace company, the patient is aware that he will be billed the whole amount. Patient is scheduled at [facility] for 04/17/22 at 11:30 AM. I mailed packet as well to the patient.

## 2022-04-13 ENCOUNTER — Ambulatory Visit (INDEPENDENT_AMBULATORY_CARE_PROVIDER_SITE_OTHER): Payer: PRIVATE HEALTH INSURANCE | Admitting: Family Medicine

## 2022-04-13 ENCOUNTER — Encounter: Payer: Self-pay | Admitting: Family Medicine

## 2022-04-13 MED ORDER — ESCITALOPRAM OXALATE 10 MG PO TABS: 10.0000 mg | ORAL_TABLET | Freq: Every day | ORAL | 1 refills | Status: DC

## 2022-04-13 NOTE — Progress Notes (Signed)
Subjective:  Patient ID: Robert Cantu., male    DOB: 2001/08/10  Age: 21 y.o. MRN: [id number]  CC: Medical Management of Chronic Issues   HPI [name]. presents for weight loss. Trying to lose so he can enter [employer]. 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   Experiencing anxiety and depression sx. His friends are elling him to get help.     04/13/2022    1:46 PM 04/13/2022    1:37 PM 01/02/2022    2:58 PM  Depression screen PHQ 2/9  Decreased Interest 1 0 0  Down, Depressed, Hopeless 2 0 0  PHQ - 2 Score 3 0 0  Altered sleeping 0    Tired, decreased energy 1    Change in appetite 2    Feeling bad or failure about yourself  3    Trouble concentrating 0    Moving slowly or fidgety/restless 0    Suicidal thoughts 0    PHQ-9 Score 9    Difficult doing work/chores Somewhat difficult        04/13/2022    1:46 PM 01/02/2022    2:58 PM  GAD 7 : Generalized Anxiety Score  Nervous, Anxious, on Edge 2 0  Control/stop worrying 1 0  Worry too much - different things 2 0  Trouble relaxing 0 1  Restless 0 0  Easily annoyed or irritable 3 1  Afraid - awful might happen 1 0  Total GAD 7 Score 9 2  Anxiety Difficulty Somewhat difficult Not difficult at all     History Andranik has a past medical history of ADHD (attention deficit hyperactivity disorder) and Depressed.   He has a past surgical history that includes Adenoidectomy; Tonsillectomy; and Tympanostomy tube placement.   His family history includes ADD / ADHD in his father; Asthma in his mother; Cancer in his mother; Depression in his mother; Diabetes in his father and mother; Heart disease in his father; Hypertension in his father.He reports that he has never smoked. He has never used smokeless tobacco. He reports that he does not drink alcohol and does not use drugs.    ROS Review of Systems  Constitutional:  Negative for fever.  Respiratory:  Negative  for shortness of breath.   Cardiovascular:  Negative for chest pain.  Musculoskeletal:  Negative for arthralgias.  Skin:  Negative for rash.    Objective:  BP 127/73   Pulse 61   Temp 98.4 F (36.9 C)   Ht 6' (1.829 m)   Wt (!) 373 lb (169.2 kg)   SpO2 96%   BMI 50.59 kg/m   BP Readings from Last 3 Encounters:  04/13/22 127/73  03/02/22 (!) 145/86  01/02/22 130/67    Wt Readings from Last 3 Encounters:  04/13/22 (!) 373 lb (169.2 kg)  03/02/22 (!) 369 lb (167.4 kg)  01/02/22 (!) 367 lb 12.8 oz (166.8 kg)     Physical Exam Vitals reviewed.  Constitutional:      Appearance: He is well-developed. He is obese.  HENT:     Head: Normocephalic and atraumatic.     Right Ear: External ear normal.     Left Ear: External ear normal.     Mouth/Throat:     Pharynx: No oropharyngeal exudate or posterior oropharyngeal erythema.  Eyes:     Pupils: Pupils are equal, round, and reactive to light.  Cardiovascular:     Rate  and Rhythm: Normal rate and regular rhythm.     Heart sounds: No murmur heard. Pulmonary:     Effort: No respiratory distress.     Breath sounds: Normal breath sounds.  Musculoskeletal:     Cervical back: Normal range of motion and neck supple.  Neurological:     Mental Status: He is alert and oriented to person, place, and time.       Assessment & Plan:   Ardis was seen today for medical management of chronic issues.  Diagnoses and all orders for this visit:  Morbid obesity (HCC)  Other orders -     escitalopram (LEXAPRO) 10 MG tablet; Take 1 tablet (10 mg total) by mouth daily.       I have discontinued [name]'s amoxicillin. I am also having him start on escitalopram.  Allergies as of 04/13/2022   No Known Allergies      Medication List        Accurate as of April 13, 2022 11:59 PM. If you have any questions, ask your nurse or doctor.          STOP taking these medications    amoxicillin 500 MG capsule Commonly  known as: AMOXIL Stopped by: Mechele Claude, MD       TAKE these medications    escitalopram 10 MG tablet Commonly known as: LEXAPRO Take 1 tablet (10 mg total) by mouth daily. Started by: Mechele Claude, MD         Follow-up: No follow-ups on file.  Mechele Claude, M.D.

## 2022-04-14 ENCOUNTER — Encounter: Payer: Self-pay | Admitting: Family Medicine

## 2022-04-17 ENCOUNTER — Ambulatory Visit (INDEPENDENT_AMBULATORY_CARE_PROVIDER_SITE_OTHER): Payer: PRIVATE HEALTH INSURANCE | Admitting: Neurology

## 2022-04-17 DIAGNOSIS — E662 Morbid (severe) obesity with alveolar hypoventilation: Secondary | ICD-10-CM

## 2022-04-17 DIAGNOSIS — G4733 Obstructive sleep apnea (adult) (pediatric): Secondary | ICD-10-CM

## 2022-04-17 DIAGNOSIS — R0683 Snoring: Secondary | ICD-10-CM

## 2022-04-20 NOTE — Progress Notes (Signed)
           [facility]   HOME SLEEP TEST REPORT ( by Watch PAT)   STUDY DATE:  04-20-2022 MRN: [id number]   ORDERING CLINICIAN: Carmen Dohmeier, MD  REFERRING CLINICIAN: Dr. Stacks, MD   CLINICAL INFORMATION/HISTORY:  03-02-2022.patient has many risk factors for OSA but overall feels fine in regards to his sleep and feels well rested.  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 Has been told he snores in sleep.  Avg 8-10 hrs of sleep and wakes up refreshed. No complaints of daytime sleepiness. Has been diagnosed with morbid ( super-) obesity, epiglottitis, tonsillitis (had tonsillectomy), ADHD, Depression.    Epworth sleepiness score endorsed at : 3/24. FSS at 18/53 points.   BMI: 50.2 kg/m   Neck Circumference: 19"   FINDINGS:   Sleep Summary:   Total Recording Time (hours, min):    6 hours 28 minutes of which the total sleep time amounted to 4 hours 37 minutes.  There were 15.5% of REM sleep time.   Respiratory Indices:   Calculated pAHI (per hour): 8.7/h                           REM pAHI:   37.6/h                                              NREM pAHI: 4.4/h                             Positional AHI: The majority of recorded sleep time was in supine position with an AHI of 11.1/h followed by right lateral sleep with an AHI of 3.4/h.  Note  Snoring statistics show a mean volume of 40 dB which is at threshold.  Snoring would have accompanied only 11% of the total sleep time.                                                 Oxygen Saturation Statistics:     O2 Saturation Range (%): Between a nadir at 89 and a maximum of 98% saturation mean oxygen saturation was measured at 94%.                                      O2 Saturation (minutes) <89%: Less than 1 minute         Pulse Rate Statistics:              Pulse Range:   Between 53 and 98 bpm with a mean heart rate of 75 bpm.              IMPRESSION:  This HST confirms the presence of unexpectedly mild  sleep apnea without hypoxemia but strong REM sleep dependence. There is no treatment option such as a dental device or inspire implant for this constellation. Given the Patient's body mass index , this result warrants an in lab titration to positive airway pressure.  The patient is at high risk of obesity hypoventilation, and the fragmented sleep architecture may still   be a result of arousals of hypoventilation. BED 2, please.   Should his insurance not permit such procedure,  I will order an auto titration CPAP device between 5 and 16 cm water pressure with 2 cm expiratory pressure relief, heated humidification and a mask of patient's choice.  In addition I strongly recommend to avoid supine sleep position and to follow with a medically guided weight loss program.   RECOMMENDATION:    INTERPRETING PHYSICIAN:   Carmen Dohmeier, MD   Medical Director of [facility].             

## 2022-04-27 DIAGNOSIS — E662 Morbid (severe) obesity with alveolar hypoventilation: Secondary | ICD-10-CM | POA: Insufficient documentation

## 2022-04-27 NOTE — Addendum Note (Signed)
Addended by: Melvyn Novas on: 04/27/2022 06:22 PM   Modules accepted: Orders

## 2022-04-27 NOTE — Procedures (Signed)
[facility] SLEEP TEST REPORT ( by Watch PAT)   STUDY DATE:  04-20-2022 MRN: [id number]   ORDERING CLINICIAN: Melvyn Novas, MD  REFERRING CLINICIAN: Dr. Darlyn Read, MD   CLINICAL INFORMATION/HISTORY:  03-02-2022.patient has many risk factors for OSA but overall feels fine in regards to his sleep and feels well rested.  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 Has been told he snores in sleep.  Avg 8-10 hrs of sleep and wakes up refreshed. No complaints of daytime sleepiness. Has been diagnosed with morbid ( super-) obesity, epiglottitis, tonsillitis (had tonsillectomy), ADHD, Depression.    Epworth sleepiness score endorsed at : 3/24. FSS at 18/53 points.   BMI: 50.2 kg/m   Neck Circumference: 19"   FINDINGS:   Sleep Summary:   Total Recording Time (hours, min):    6 hours 28 minutes of which the total sleep time amounted to 4 hours 37 minutes.  There were 15.5% of REM sleep time.   Respiratory Indices:   Calculated pAHI (per hour): 8.7/h                           REM pAHI:   37.6/h                                              NREM pAHI: 4.4/h                             Positional AHI: The majority of recorded sleep time was in supine position with an AHI of 11.1/h followed by right lateral sleep with an AHI of 3.4/h.  Note  Snoring statistics show a mean volume of 40 dB which is at threshold.  Snoring would have accompanied only 11% of the total sleep time.                                                 Oxygen Saturation Statistics:     O2 Saturation Range (%): Between a nadir at 89 and a maximum of 98% saturation mean oxygen saturation was measured at 94%.                                      O2 Saturation (minutes) <89%: Less than 1 minute         Pulse Rate Statistics:              Pulse Range:   Between 53 and 98 bpm with a mean heart rate of 75 bpm.              IMPRESSION:  This HST confirms the presence of unexpectedly mild  sleep apnea without hypoxemia but strong REM sleep dependence. There is no treatment option such as a dental device or inspire implant for this constellation. Given the Patient's body mass index , this result warrants an in lab titration to positive airway pressure.  The patient is at high risk of obesity hypoventilation, and the fragmented sleep architecture may still  be a result of arousals of hypoventilation. BED 2, please.   Should his insurance not permit such procedure,  I will order an auto titration CPAP device between 5 and 16 cm water pressure with 2 cm expiratory pressure relief, heated humidification and a mask of patient's choice.  In addition I strongly recommend to avoid supine sleep position and to follow with a medically guided weight loss program.   RECOMMENDATION:    INTERPRETING PHYSICIAN:   Melvyn Novas, MD   Medical Director of [facility].

## 2022-05-03 ENCOUNTER — Telehealth: Payer: Self-pay | Admitting: Neurology

## 2022-05-03 NOTE — Telephone Encounter (Signed)
Called the patient back. There was no answer. Lvm again for the pt to call back

## 2022-05-03 NOTE — Telephone Encounter (Signed)
-----   Message from Melvyn Novas, MD sent at 04/27/2022  6:22 PM EDT ----- IMPRESSION:  This HST confirms the presence of unexpectedly mild sleep apnea without hypoxemia but strong REM sleep dependence. There is no treatment option such as a dental device or inspire implant for this constellation. Given the Patient's body mass index , this result warrants an in lab titration to positive airway pressure.  The patient is at high risk of obesity hypoventilation, and the fragmented sleep architecture may still be a result of arousals of hypoventilation. BED 2, please.   Should his insurance not permit such procedure,  I will order an auto titration CPAP device between 5 and 16 cm water pressure with 2 cm expiratory pressure relief, heated humidification and a mask of patient's choice.  In addition I strongly recommend to avoid supine sleep position and to follow with a medically guided weight loss program.

## 2022-05-03 NOTE — Telephone Encounter (Signed)
Pt called back and requested a nurse give him a return call.

## 2022-05-03 NOTE — Telephone Encounter (Signed)
Called patient to discuss sleep study results. No answer at this time. LVM for the patient to call back.   

## 2022-05-08 NOTE — Telephone Encounter (Signed)
Noted, thank you

## 2022-05-08 NOTE — Telephone Encounter (Signed)
Took call from phone staff and spoke w/ pt about results. He is going to talk with [employer] and will call back if he wants to proceed with CPAP titration.

## 2022-05-25 ENCOUNTER — Ambulatory Visit: Payer: PRIVATE HEALTH INSURANCE | Admitting: Family Medicine

## 2022-06-15 ENCOUNTER — Encounter: Payer: Self-pay | Admitting: Family Medicine

## 2022-06-15 ENCOUNTER — Ambulatory Visit (INDEPENDENT_AMBULATORY_CARE_PROVIDER_SITE_OTHER): Payer: PRIVATE HEALTH INSURANCE | Admitting: Family Medicine

## 2022-06-15 DIAGNOSIS — F418 Other specified anxiety disorders: Secondary | ICD-10-CM | POA: Diagnosis not present

## 2022-06-15 MED ORDER — ESCITALOPRAM OXALATE 20 MG PO TABS: 20.0000 mg | ORAL_TABLET | Freq: Every day | ORAL | 1 refills | Status: DC

## 2022-06-15 NOTE — Progress Notes (Signed)
Subjective:  Patient ID: Robert Santa., male    DOB: 10/23/2000  Age: 21 y.o. MRN: [id number]  CC: Follow-up (Depression/anxiety)   HPI Robert Santa. presents for feeling some improvement in sx but still some sx as noted below. Mostly still easily irritated and worries a lot.   Continues efforts to lose weight but not losing. Needs help.       06/15/2022    1:02 PM 04/13/2022    1:46 PM 04/13/2022    1:37 PM  Depression screen PHQ 2/9  Decreased Interest 0 1 0  Down, Depressed, Hopeless 1 2 0  PHQ - 2 Score 1 3 0  Altered sleeping 1 0   Tired, decreased energy 0 1   Change in appetite 2 2   Feeling bad or failure about yourself  0 3   Trouble concentrating 0 0   Moving slowly or fidgety/restless 0 0   Suicidal thoughts 0 0   PHQ-9 Score 4 9   Difficult doing work/chores Not difficult at all Somewhat difficult       06/15/2022    1:02 PM 04/13/2022    1:46 PM 01/02/2022    2:58 PM  GAD 7 : Generalized Anxiety Score  Nervous, Anxious, on Edge 1 2 0  Control/stop worrying 0 1 0  Worry too much - different things 2 2 0  Trouble relaxing 0 0 1  Restless 0 0 0  Easily annoyed or irritable 3 3 1   Afraid - awful might happen 1 1 0  Total GAD 7 Score 7 9 2   Anxiety Difficulty Not difficult at all Somewhat difficult Not difficult at all     History Robert Cantu has a past medical history of ADHD (attention deficit hyperactivity disorder), Depressed, and Sleep apnea.   Robert Cantu has a past surgical history that includes Adenoidectomy; Tonsillectomy; and Tympanostomy tube placement.   His family history includes ADD / ADHD in his father; Asthma in his mother; Cancer in his mother; Depression in his mother; Diabetes in his father and mother; Heart disease in his father; Hypertension in his father.Robert Cantu reports that Robert Cantu has never smoked. Robert Cantu has never used smokeless tobacco. Robert Cantu reports that Robert Cantu does not drink alcohol and does not use drugs.    ROS Review of Systems  Constitutional:   Negative for fever.  Respiratory:  Negative for shortness of breath.   Cardiovascular:  Negative for chest pain.  Musculoskeletal:  Negative for arthralgias.  Skin:  Negative for rash.    Objective:  BP (!) 122/57   Pulse 60   Temp 97.9 F (36.6 C)   Ht 6' (1.829 m)   Wt (!) 377 lb 3.2 oz (171.1 kg)   SpO2 95%   BMI 51.16 kg/m   BP Readings from Last 3 Encounters:  06/15/22 (!) 122/57  04/13/22 127/73  03/02/22 (!) 145/86    Wt Readings from Last 3 Encounters:  06/15/22 (!) 377 lb 3.2 oz (171.1 kg)  04/13/22 (!) 373 lb (169.2 kg)  03/02/22 (!) 369 lb (167.4 kg)     Physical Exam Vitals reviewed.  Constitutional:      Appearance: Robert Cantu is well-developed.  HENT:     Head: Normocephalic and atraumatic.     Right Ear: External ear normal.     Left Ear: External ear normal.     Mouth/Throat:     Pharynx: No oropharyngeal exudate or posterior oropharyngeal erythema.  Eyes:     Pupils: Pupils are equal, round,  and reactive to light.  Cardiovascular:     Rate and Rhythm: Normal rate and regular rhythm.     Heart sounds: No murmur heard. Pulmonary:     Effort: No respiratory distress.     Breath sounds: Normal breath sounds.  Musculoskeletal:     Cervical back: Normal range of motion and neck supple.  Neurological:     Mental Status: Robert Cantu is alert and oriented to person, place, and time.       Assessment & Plan:   Robert Cantu was seen today for follow-up.  Diagnoses and all orders for this visit:  Morbid obesity (HCC)  Anxiety with depression  Other orders -     escitalopram (LEXAPRO) 20 MG tablet; Take 1 tablet (20 mg total) by mouth daily.       I have changed Robert Loron Jr.'s escitalopram.  Allergies as of 06/15/2022   No Known Allergies      Medication List        Accurate as of June 15, 2022 11:59 PM. If you have any questions, ask your nurse or doctor.          escitalopram 20 MG tablet Commonly known as: LEXAPRO Take 1 tablet (20  mg total) by mouth daily. What changed:  medication strength how much to take Changed by: Mechele Claude, MD         Follow-up: Return in about 6 weeks (around 07/27/2022) for Depression, Anxiety.  Mechele Claude, M.D.

## 2022-07-27 ENCOUNTER — Ambulatory Visit (INDEPENDENT_AMBULATORY_CARE_PROVIDER_SITE_OTHER): Payer: PRIVATE HEALTH INSURANCE | Admitting: Family Medicine

## 2022-07-27 ENCOUNTER — Encounter: Payer: Self-pay | Admitting: Family Medicine

## 2022-07-27 VITALS — BP 116/48 | HR 70 | Temp 98.1°F | Ht 72.0 in | Wt 376.0 lb

## 2022-07-27 DIAGNOSIS — F418 Other specified anxiety disorders: Secondary | ICD-10-CM | POA: Diagnosis not present

## 2022-07-27 DIAGNOSIS — Z7251 High risk heterosexual behavior: Secondary | ICD-10-CM | POA: Diagnosis not present

## 2022-07-27 NOTE — Progress Notes (Signed)
Subjective:  Patient ID: Robert Leavens., male    DOB: Jun 23, 2001  Age: 21 y.o. MRN: [id number]  CC: Depression and Anxiety   HPI Robert Cantu. presents for recheck of depression See below PHQ. Reviewed with pt. He also has started a new relationship. Both are making sure they are disease free.     07/27/2022    1:58 PM 06/15/2022    1:02 PM 04/13/2022    1:46 PM  Depression screen PHQ 2/9  Decreased Interest 0 0 1  Down, Depressed, Hopeless 0 1 2  PHQ - 2 Score 0 1 3  Altered sleeping 0 1 0  Tired, decreased energy 1 0 1  Change in appetite 2 2 2   Feeling bad or failure about yourself  2 0 3  Trouble concentrating 0 0 0  Moving slowly or fidgety/restless 0 0 0  Suicidal thoughts 0 0 0  PHQ-9 Score 5 4 9   Difficult doing work/chores Not difficult at all Not difficult at all Somewhat difficult    History Robert Cantu has a past medical history of ADHD (attention deficit hyperactivity disorder), Depressed, and Sleep apnea.   He has a past surgical history that includes Adenoidectomy; Tonsillectomy; and Tympanostomy tube placement.   His family history includes ADD / ADHD in his father; Asthma in his mother; Cancer in his mother; Depression in his mother; Diabetes in his father and mother; Heart disease in his father; Hypertension in his father.He reports that he has never smoked. He has never used smokeless tobacco. He reports that he does not drink alcohol and does not use drugs.    ROS Review of Systems  Constitutional:  Negative for fever.  Respiratory:  Negative for shortness of breath.   Cardiovascular:  Negative for chest pain.  Musculoskeletal:  Negative for arthralgias.  Skin:  Negative for rash.  Psychiatric/Behavioral:  Positive for dysphoric mood.     Objective:  BP (!) 116/48   Pulse 70   Temp 98.1 F (36.7 C)   Ht 6' (1.829 m)   Wt (!) 376 lb (170.6 kg)   SpO2 96%   BMI 50.99 kg/m   BP Readings from Last 3 Encounters:  07/27/22 (!) 116/48   06/15/22 (!) 122/57  04/13/22 127/73    Wt Readings from Last 3 Encounters:  07/27/22 (!) 376 lb (170.6 kg)  06/15/22 (!) 377 lb 3.2 oz (171.1 kg)  04/13/22 (!) 373 lb (169.2 kg)     Physical Exam Vitals reviewed.  Constitutional:      Appearance: He is well-developed.  HENT:     Head: Normocephalic and atraumatic.     Right Ear: External ear normal.     Left Ear: External ear normal.     Mouth/Throat:     Pharynx: No oropharyngeal exudate or posterior oropharyngeal erythema.  Eyes:     Pupils: Pupils are equal, round, and reactive to light.  Cardiovascular:     Rate and Rhythm: Normal rate and regular rhythm.     Heart sounds: No murmur heard. Pulmonary:     Effort: No respiratory distress.     Breath sounds: Normal breath sounds.  Musculoskeletal:     Cervical back: Normal range of motion and neck supple.  Neurological:     Mental Status: He is alert and oriented to person, place, and time.       Assessment & Plan:   Robert Cantu was seen today for depression and anxiety.  Diagnoses and all orders for this  visit:  High risk heterosexual behavior -     HepB+HepC+HIV Panel -     RPR -     GC/Chlamydia probe amp ()not at [facility]       I am having Robert Leavens. maintain his escitalopram.  Allergies as of 07/27/2022   No Known Allergies      Medication List        Accurate as of July 27, 2022  3:05 PM. If you have any questions, ask your nurse or doctor.          escitalopram 20 MG tablet Commonly known as: LEXAPRO Take 1 tablet (20 mg total) by mouth daily.         Follow-up: Return in about 6 weeks (around 09/07/2022).  Mechele Claude, M.D.

## 2022-09-28 ENCOUNTER — Encounter: Payer: Self-pay | Admitting: Family Medicine

## 2022-09-28 ENCOUNTER — Ambulatory Visit (INDEPENDENT_AMBULATORY_CARE_PROVIDER_SITE_OTHER): Payer: PRIVATE HEALTH INSURANCE | Admitting: Family Medicine

## 2022-09-28 VITALS — BP 113/68 | HR 62 | Temp 98.2°F | Ht 72.0 in | Wt 362.0 lb

## 2022-09-28 DIAGNOSIS — Z7251 High risk heterosexual behavior: Secondary | ICD-10-CM | POA: Diagnosis not present

## 2022-09-28 DIAGNOSIS — F418 Other specified anxiety disorders: Secondary | ICD-10-CM

## 2022-09-28 MED ORDER — SERTRALINE HCL 100 MG PO TABS: 100.0000 mg | ORAL_TABLET | Freq: Every day | ORAL | 5 refills | Status: DC

## 2022-09-28 NOTE — Progress Notes (Signed)
Subjective:  Patient ID: Robert Santa., male    DOB: 2000-09-17  Age: 22 y.o. MRN: [id number]  CC: 6 weeek f/u depression (STI screen)   HPI Robert Cantu. presents for doing well with regard to depression.  Unfortunately Robert Cantu could not afford the S-Citalopram because Robert Cantu lost his job.  Robert Cantu is trying to find perhaps a cheaper antidepressant to take.  Additionally his girlfriend is upset with him because she was tested and found to be positive for herpes simplex virus 1.  Patient is unsure about HSV-2.  Robert Cantu does want a full STI check including HSV 1 and 2.     09/28/2022    3:22 PM 07/27/2022    1:58 PM 06/15/2022    1:02 PM  Depression screen PHQ 2/9  Decreased Interest 0 0 0  Down, Depressed, Hopeless 0 0 1  PHQ - 2 Score 0 0 1  Altered sleeping 0 0 1  Tired, decreased energy 0 1 0  Change in appetite 0 2 2  Feeling bad or failure about yourself  0 2 0  Trouble concentrating 1 0 0  Moving slowly or fidgety/restless 0 0 0  Suicidal thoughts 0 0 0  PHQ-9 Score 1 5 4   Difficult doing work/chores Not difficult at all Not difficult at all Not difficult at all    History Robert Cantu has a past medical history of ADHD (attention deficit hyperactivity disorder), Depressed, and Sleep apnea.   Robert Cantu has a past surgical history that includes Adenoidectomy; Tonsillectomy; and Tympanostomy tube placement.   His family history includes ADD / ADHD in his father; Asthma in his mother; Cancer in his mother; Depression in his mother; Diabetes in his father and mother; Heart disease in his father; Hypertension in his father.Robert Cantu reports that Robert Cantu has never smoked. Robert Cantu has never used smokeless tobacco. Robert Cantu reports that Robert Cantu does not drink alcohol and does not use drugs.    ROS Review of Systems  Objective:  BP 113/68   Pulse 62   Temp 98.2 F (36.8 C)   Ht 6' (1.829 m)   Wt (!) 362 lb (164.2 kg)   SpO2 98%   BMI 49.10 kg/m   BP Readings from Last 3 Encounters:  09/28/22 113/68  07/27/22 (!)  116/48  06/15/22 (!) 122/57    Wt Readings from Last 3 Encounters:  09/28/22 (!) 362 lb (164.2 kg)  07/27/22 (!) 376 lb (170.6 kg)  06/15/22 (!) 377 lb 3.2 oz (171.1 kg)     Physical Exam Vitals reviewed.  Constitutional:      Appearance: Robert Cantu is well-developed.  HENT:     Head: Normocephalic and atraumatic.     Right Ear: External ear normal.     Left Ear: External ear normal.     Mouth/Throat:     Pharynx: No oropharyngeal exudate or posterior oropharyngeal erythema.  Eyes:     Pupils: Pupils are equal, round, and reactive to light.  Cardiovascular:     Rate and Rhythm: Normal rate and regular rhythm.     Heart sounds: No murmur heard. Pulmonary:     Effort: No respiratory distress.     Breath sounds: Normal breath sounds.  Musculoskeletal:     Cervical back: Normal range of motion and neck supple.  Neurological:     Mental Status: Robert Cantu is alert and oriented to person, place, and time.       Assessment & Plan:   Robert Cantu was seen today for 6 weeek  f/u depression.  Diagnoses and all orders for this visit:  High risk heterosexual behavior -     STI Profile  Other orders -     sertraline (ZOLOFT) 100 MG tablet; Take 1 tablet (100 mg total) by mouth daily. -     Interpretation:       I have discontinued Robert Speller Jr.'s escitalopram. I am also having him start on sertraline.  Allergies as of 09/28/2022   No Known Allergies      Medication List        Accurate as of September 28, 2022 11:59 PM. If you have any questions, ask your nurse or doctor.          STOP taking these medications    escitalopram 20 MG tablet Commonly known as: LEXAPRO Stopped by: Claretta Fraise, MD       TAKE these medications    sertraline 100 MG tablet Commonly known as: ZOLOFT Take 1 tablet (100 mg total) by mouth daily. Started by: Claretta Fraise, MD         Follow-up: Return in about 3 months (around 12/28/2022), or if symptoms worsen or fail to  improve.  Claretta Fraise, M.D.

## 2022-09-29 LAB — STI PROFILE
HCV Ab: NONREACTIVE
HIV Screen 4th Generation wRfx: NONREACTIVE
Hep B Core Total Ab: NEGATIVE
Hep B Surface Ab, Qual: NONREACTIVE
Hepatitis B Surface Ag: NEGATIVE
RPR Ser Ql: NONREACTIVE

## 2022-09-29 LAB — HCV INTERPRETATION

## 2022-10-01 ENCOUNTER — Encounter: Payer: Self-pay | Admitting: Family Medicine

## 2022-10-04 ENCOUNTER — Telehealth: Payer: Self-pay | Admitting: Family Medicine

## 2022-10-04 NOTE — Telephone Encounter (Signed)
No StD , but the one he was concerned about isn't back yet

## 2022-10-04 NOTE — Telephone Encounter (Signed)
Patient wants to speak with PCP or nurse regarding test results.

## 2022-10-04 NOTE — Telephone Encounter (Signed)
Patient aware

## 2022-10-04 NOTE — Telephone Encounter (Signed)
Please review and advise.

## 2022-10-06 LAB — HSV-2 AB, IGG: HSV 2 IgG, Type Spec: <0.91 index (ref 0.00–0.90)

## 2022-10-06 LAB — HSV DNA BY PCR (REFERENCE LAB)
HSV 2 DNA: NEGATIVE
HSV-1 DNA: NEGATIVE

## 2022-10-06 LAB — SPECIMEN STATUS REPORT

## 2023-02-12 IMAGING — CT CT ABD-PELV W/ CM
2 of 4 series · 16 of 46 positions shown, 18 images · IV contrast (omnipaque)
Comparison: None.

CLINICAL DATA: 19-year-old male with right lower quadrant abdominal
pain.

EXAM:
CT ABDOMEN AND PELVIS WITH CONTRAST
TECHNIQUE: Multidetector CT imaging of the abdomen and pelvis was performed
using the standard protocol following bolus administration of
intravenous contrast.
CONTRAST:  100mL OMNIPAQUE IOHEXOL 300 MG/ML  SOLN

[Series 2: axial st · axial · 0.98mm/px · z∈[-584,-114]mm · 13 of 106 slices shown, 15 images]
[im 6/106  soft-tissue]
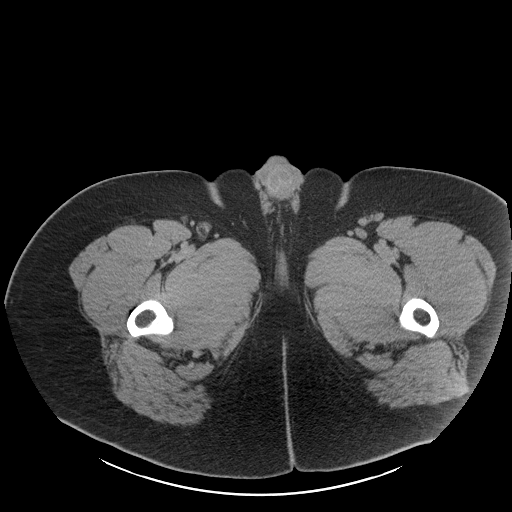
[im 6/106  bone]
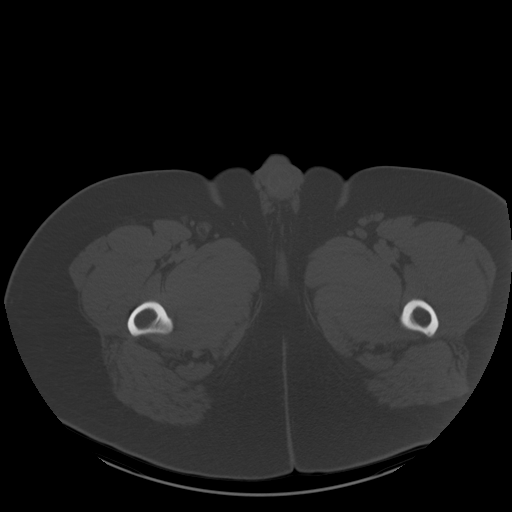
[im 17/106  soft-tissue]
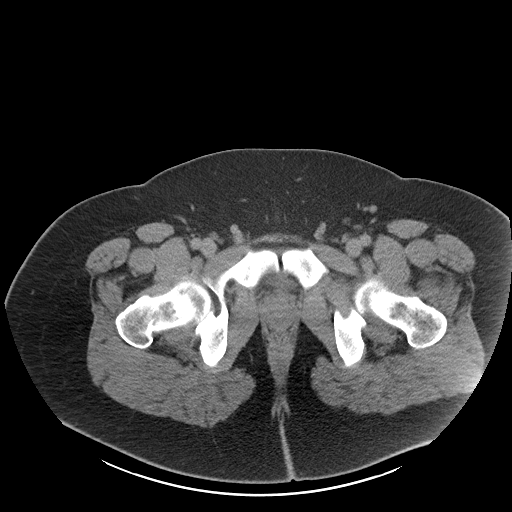
[im 23/106  soft-tissue]
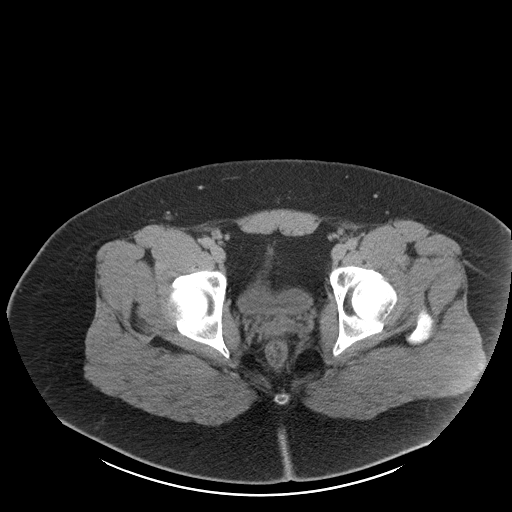
[im 28/106  soft-tissue]
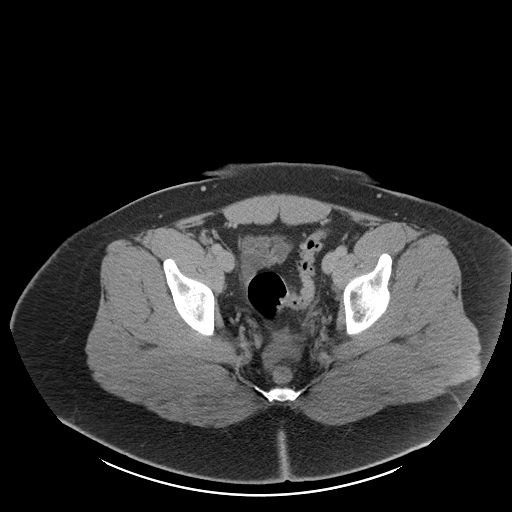
[im 39/106  soft-tissue]
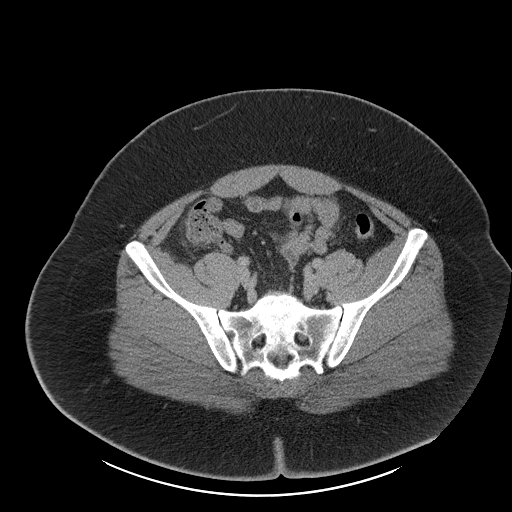
[im 45/106  soft-tissue]
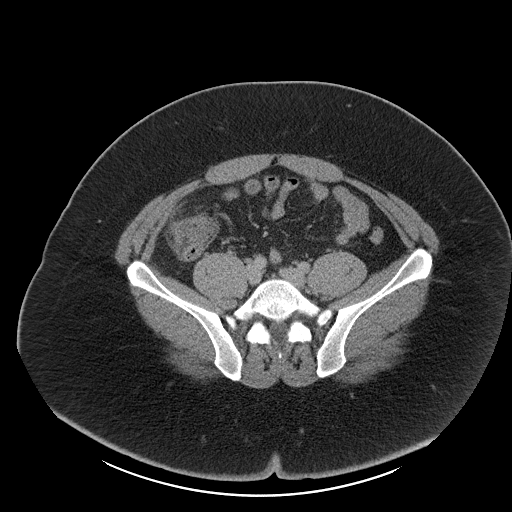
[im 56/106  soft-tissue]
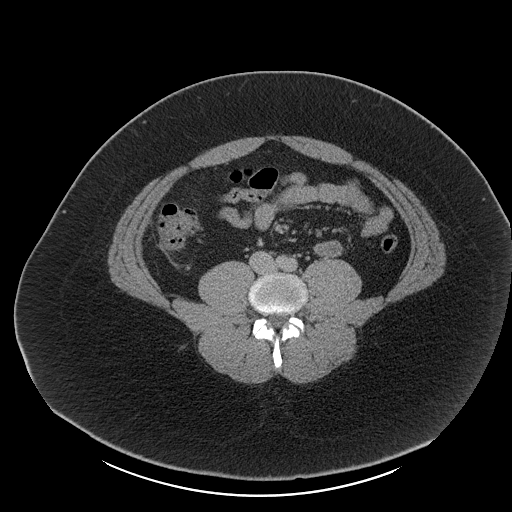
[im 61/106  soft-tissue]
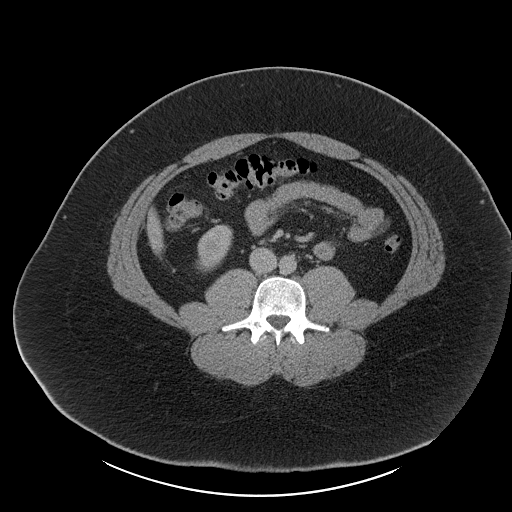
[im 67/106  soft-tissue]
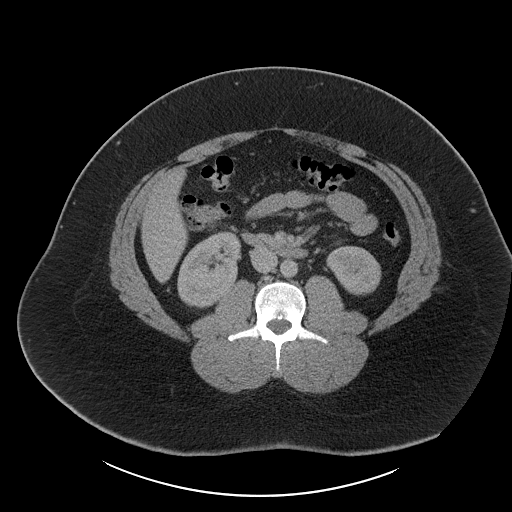
[im 67/106  bone]
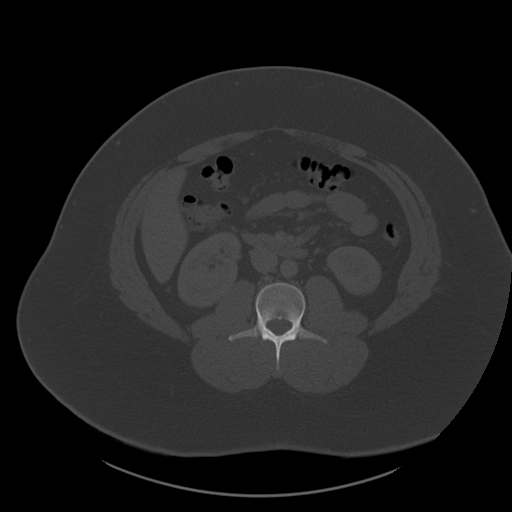
[im 78/106  soft-tissue]
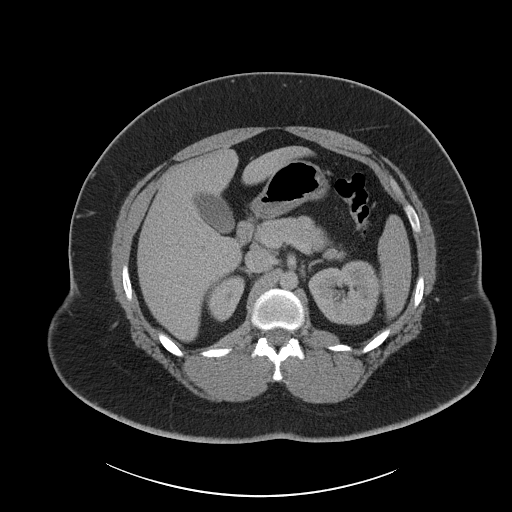
[im 83/106  soft-tissue]
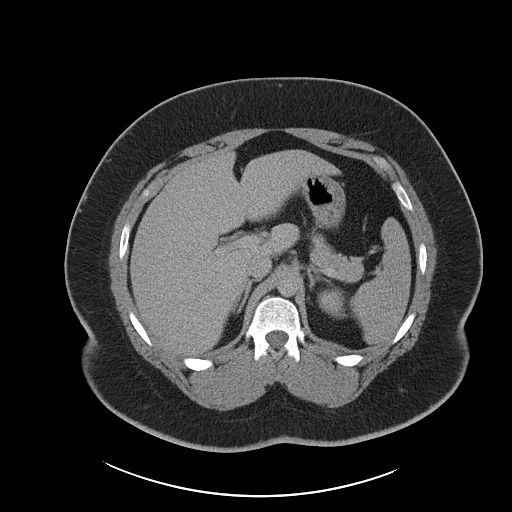
[im 89/106  soft-tissue]
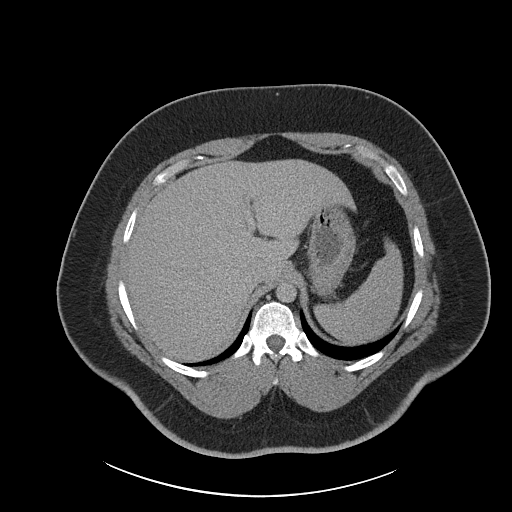
[im 100/106  soft-tissue]
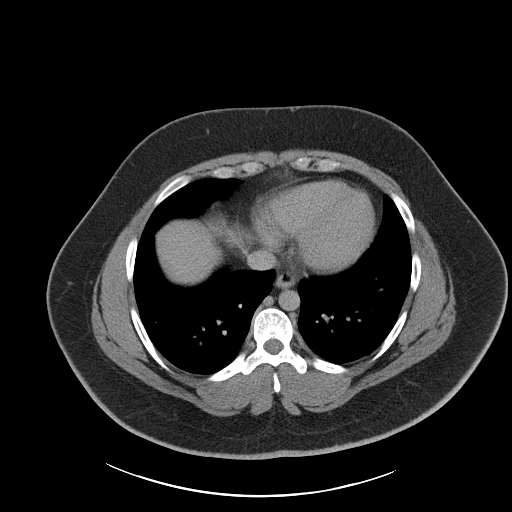

[Series 5: coronal st · coronal · 0.97mm/px · 3 of 200 slices shown]
[im 67/200  soft-tissue]
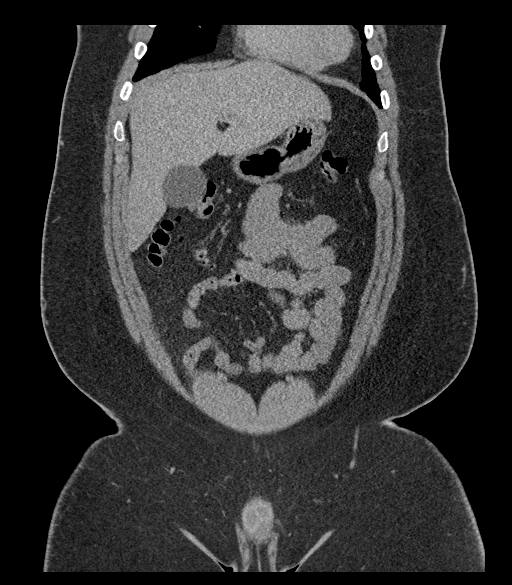
[im 89/200  soft-tissue]
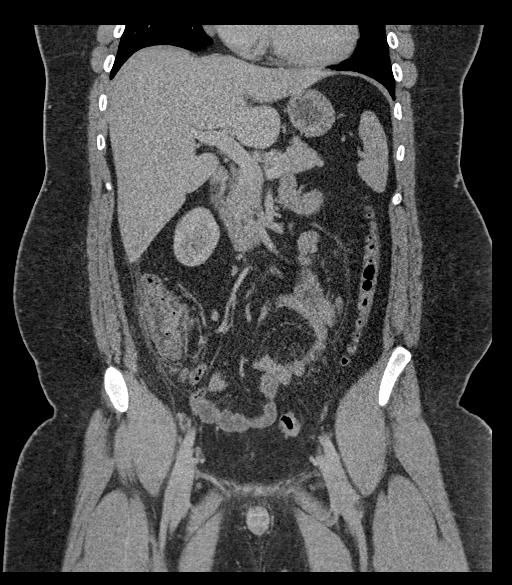
[im 111/200  soft-tissue]
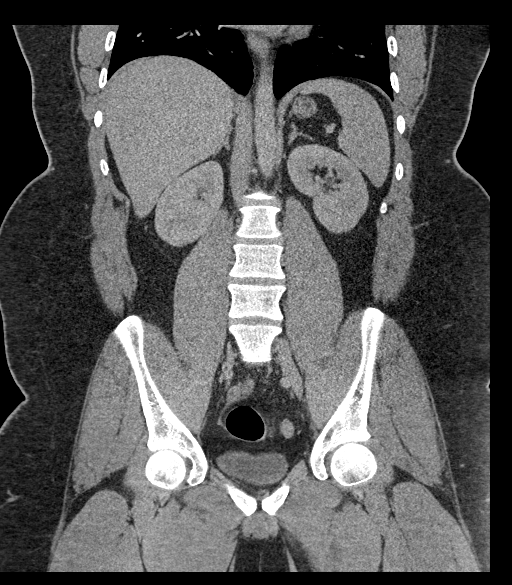

[16 of 46 positions shown; findings below may reference images not displayed]

FINDINGS: Lower chest: The visualized lung bases are clear.

No intra-abdominal free air.  Small free fluid in the pelvis.

Hepatobiliary: No focal liver abnormality is seen. No gallstones,
gallbladder wall thickening, or biliary dilatation.

Pancreas: Unremarkable. No pancreatic ductal dilatation or
surrounding inflammatory changes.

Spleen: Normal in size without focal abnormality.

Adrenals/Urinary Tract: Adrenal glands are unremarkable. Kidneys are
normal, without renal calculi, focal lesion, or hydronephrosis.
Bladder is unremarkable.

Stomach/Bowel: There is no bowel obstruction. The appendix is
normal. There is focal inflammatory changes of the fat adjacent to
the proximal ascending colon in the right lower quadrant consistent
with epiploic appendagitis. No fluid collection or abscess.

Vascular/Lymphatic: No significant vascular findings are present. No
enlarged abdominal or pelvic lymph nodes.

Reproductive: The prostate and seminal vesicles are grossly
unremarkable. No pelvic mass.

Other: None

Musculoskeletal: No acute or significant osseous findings.
IMPRESSION: 1. Epiploic appendagitis in the right lower quadrant. No abscess.
2. No bowel obstruction. Normal appendix.

## 2023-03-12 ENCOUNTER — Ambulatory Visit: Payer: PRIVATE HEALTH INSURANCE | Admitting: Family Medicine

## 2023-03-27 ENCOUNTER — Ambulatory Visit (INDEPENDENT_AMBULATORY_CARE_PROVIDER_SITE_OTHER): Payer: PRIVATE HEALTH INSURANCE | Admitting: Family Medicine

## 2023-03-27 ENCOUNTER — Encounter: Payer: Self-pay | Admitting: Family Medicine

## 2023-03-27 DIAGNOSIS — F418 Other specified anxiety disorders: Secondary | ICD-10-CM

## 2023-03-27 DIAGNOSIS — Z13220 Encounter for screening for lipoid disorders: Secondary | ICD-10-CM

## 2023-03-27 DIAGNOSIS — Z6841 Body Mass Index (BMI) 40.0 and over, adult: Secondary | ICD-10-CM | POA: Diagnosis not present

## 2023-03-27 MED ORDER — ESCITALOPRAM OXALATE 10 MG PO TABS: 10.0000 mg | ORAL_TABLET | Freq: Every day | ORAL | 1 refills | Status: AC

## 2023-03-27 NOTE — Progress Notes (Signed)
Subjective:  Patient ID: Robert Cantu., male    DOB: 2000-09-28  Age: 22 y.o. MRN: [id number]  CC: Medical Management of Chronic Issues   HPI Robert Cantu. presents for stable income now. Wants to esume lexapro. Struggling with bits of depression. 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      03/27/2023   10:46 AM 03/27/2023   10:22 AM 09/28/2022    3:22 PM  Depression screen PHQ 2/9  Decreased Interest 1 0 0  Down, Depressed, Hopeless 1 0 0  PHQ - 2 Score 2 0 0  Altered sleeping 0  0  Tired, decreased energy 0  0  Change in appetite 1  0  Feeling bad or failure about yourself  2  0  Trouble concentrating 0  1  Moving slowly or fidgety/restless 1  0  Suicidal thoughts 0  0  PHQ-9 Score 6  1  Difficult doing work/chores Not difficult at all  Not difficult at all    History Robert Cantu has a past medical history of ADHD (attention deficit hyperactivity disorder), Depressed, and Sleep apnea.   He has a past surgical history that includes Adenoidectomy; Tonsillectomy; and Tympanostomy tube placement.   His family history includes ADD / ADHD in his father; Asthma in his mother; Cancer in his mother; Depression in his mother; Diabetes in his father and mother; Heart disease in his father; Hypertension in his father.He reports that he has never smoked. He has never used smokeless tobacco. He reports that he does not drink alcohol and does not use drugs.    ROS Review of Systems  Constitutional:  Negative for fever.  Respiratory:  Negative for shortness of breath.   Cardiovascular:  Negative for chest pain.  Musculoskeletal:  Negative for arthralgias.  Skin:  Negative for rash.    Objective:  BP 122/65   Pulse 80   Temp 97.9 F (36.6 C)   Ht 6' (1.829 m)   Wt (!) 340 lb 3.2 oz (154.3 kg)   SpO2 96%   BMI 46.14 kg/m   BP Readings from Last 3 Encounters:  03/27/23 122/65  09/28/22 113/68  07/27/22 (!) 116/48    Wt Readings from Last 3 Encounters:   03/27/23 (!) 340 lb 3.2 oz (154.3 kg)  09/28/22 (!) 362 lb (164.2 kg)  07/27/22 (!) 376 lb (170.6 kg)     Physical Exam Vitals reviewed.  Constitutional:      Appearance: He is well-developed.  HENT:     Head: Normocephalic and atraumatic.     Right Ear: External ear normal.     Left Ear: External ear normal.     Mouth/Throat:     Pharynx: No oropharyngeal exudate or posterior oropharyngeal erythema.  Eyes:     Pupils: Pupils are equal, round, and reactive to light.  Cardiovascular:     Rate and Rhythm: Normal rate and regular rhythm.     Heart sounds: No murmur heard. Pulmonary:     Effort: No respiratory distress.     Breath sounds: Normal breath sounds.  Musculoskeletal:     Cervical back: Normal range of motion and neck supple.  Neurological:     Mental Status: He is alert and oriented to person, place, and time.       Assessment & Plan:   There are no diagnoses linked to this encounter.     I have discontinued [name]'s sertraline.  Allergies as of 03/27/2023   No  Known Allergies      Medication List        Accurate as of March 27, 2023 10:48 AM. If you have any questions, ask your nurse or doctor.          STOP taking these medications    sertraline 100 MG tablet Commonly known as: ZOLOFT Stopped by: Broadus John Duanne Duchesne         Follow-up: No follow-ups on file.  Mechele Claude, M.D.

## 2023-03-28 LAB — CBC WITH DIFFERENTIAL/PLATELET
Basophils Absolute: 0 10*3/uL (ref 0.0–0.2)
Basos: 0 %
EOS (ABSOLUTE): 0.1 10*3/uL (ref 0.0–0.4)
Eos: 1 %
Hematocrit: 45.4 % (ref 37.5–51.0)
Hemoglobin: 15.1 g/dL (ref 13.0–17.7)
Immature Grans (Abs): 0 10*3/uL (ref 0.0–0.1)
Immature Granulocytes: 0 %
Lymphocytes Absolute: 2.6 10*3/uL (ref 0.7–3.1)
Lymphs: 29 %
MCH: 28.5 pg (ref 26.6–33.0)
MCHC: 33.3 g/dL (ref 31.5–35.7)
MCV: 86 fL (ref 79–97)
Monocytes Absolute: 0.9 10*3/uL (ref 0.1–0.9)
Monocytes: 10 %
Neutrophils Absolute: 5.5 10*3/uL (ref 1.4–7.0)
Neutrophils: 60 %
Platelets: 293 10*3/uL (ref 150–450)
RBC: 5.3 x10E6/uL (ref 4.14–5.80)
RDW: 13.1 % (ref 11.6–15.4)
WBC: 9.1 10*3/uL (ref 3.4–10.8)

## 2023-03-28 LAB — CMP14+EGFR
ALT: 33 IU/L (ref 0–44)
AST: 25 IU/L (ref 0–40)
Albumin: 4.6 g/dL (ref 4.3–5.2)
Alkaline Phosphatase: 105 IU/L (ref 44–121)
BUN/Creatinine Ratio: 15 (ref 9–20)
BUN: 13 mg/dL (ref 6–20)
Bilirubin Total: 0.8 mg/dL (ref 0.0–1.2)
CO2: 25 mmol/L (ref 20–29)
Calcium: 10 mg/dL (ref 8.7–10.2)
Chloride: 102 mmol/L (ref 96–106)
Creatinine, Ser: 0.89 mg/dL (ref 0.76–1.27)
Globulin, Total: 3 g/dL (ref 1.5–4.5)
Glucose: 86 mg/dL (ref 70–99)
Potassium: 4.3 mmol/L (ref 3.5–5.2)
Sodium: 140 mmol/L (ref 134–144)
Total Protein: 7.6 g/dL (ref 6.0–8.5)
eGFR: 124 mL/min/{1.73_m2} (ref >59)

## 2023-03-28 LAB — LIPID PANEL
Chol/HDL Ratio: 4.5 ratio (ref 0.0–5.0)
Cholesterol, Total: 170 mg/dL (ref 100–199)
HDL: 38 mg/dL — ABNORMAL LOW (ref >39)
LDL Chol Calc (NIH): 108 mg/dL — ABNORMAL HIGH (ref 0–99)
Triglycerides: 135 mg/dL (ref 0–149)
VLDL Cholesterol Cal: 24 mg/dL (ref 5–40)

## 2023-03-28 NOTE — Progress Notes (Signed)
 Hello Affan,  Your lab result is normal and/or stable.Some minor variations that are not significant are commonly marked abnormal, but do not represent any medical problem for you.  Best regards, Warren Stacks, M.D.

## 2023-09-26 ENCOUNTER — Ambulatory Visit: Payer: PRIVATE HEALTH INSURANCE | Admitting: Family Medicine

## 2023-12-30 ENCOUNTER — Other Ambulatory Visit: Payer: Self-pay

## 2023-12-30 ENCOUNTER — Encounter (HOSPITAL_BASED_OUTPATIENT_CLINIC_OR_DEPARTMENT_OTHER): Payer: Self-pay | Admitting: Emergency Medicine

## 2023-12-30 ENCOUNTER — Emergency Department (HOSPITAL_BASED_OUTPATIENT_CLINIC_OR_DEPARTMENT_OTHER)
Admission: EM | Admit: 2023-12-30 | Discharge: 2023-12-30 | Disposition: A | Discharge status: Home / Self Care | Payer: Self-pay | Attending: Emergency Medicine | Admitting: Emergency Medicine

## 2023-12-30 DIAGNOSIS — H5712 Ocular pain, left eye: Secondary | ICD-10-CM | POA: Insufficient documentation

## 2023-12-30 MED ORDER — TETRACAINE HCL 0.5 % OP SOLN
2.0000 [drp] | Freq: Once | OPHTHALMIC | Status: AC
  Administered 2023-12-30: 2 [drp] via OPHTHALMIC
  Filled 2023-12-30: qty 4

## 2023-12-30 MED ORDER — ERYTHROMYCIN 5 MG/GM OP OINT
TOPICAL_OINTMENT | Freq: Once | OPHTHALMIC | Status: AC
  Filled 2023-12-30: qty 3.5

## 2023-12-30 MED ORDER — FLUORESCEIN SODIUM 1 MG OP STRP
1.0000 | ORAL_STRIP | Freq: Once | OPHTHALMIC | Status: AC
  Administered 2023-12-30: 1 via OPHTHALMIC
  Filled 2023-12-30: qty 1

## 2023-12-30 NOTE — ED Notes (Signed)
 Patient given discharge instructions. Questions were answered. Patient verbalized understanding of discharge instructions and care at home.

## 2023-12-30 NOTE — ED Triage Notes (Signed)
 States was weed eating yesterday and thinks he had grass in his L eye. Attempted to flush eye out multiple times but no relief. Denies vision trouble in the eye. Appears red and swollen.

## 2023-12-30 NOTE — ED Provider Notes (Signed)
 [facility] Provider Note   CSN: [id number] Arrival date & time: 12/30/23  6578     History  Chief Complaint  Patient presents with   Eye Pain    Robert Cantu. is a 23 y.o. male.  Patient with history of obesity presents today with complaints of left eye pain. He states that same occurred yesterday when he was weed-eating outside and felt something hit him in the eye. He has attempted to flush out his eye numerous times with minimal relief. Denies vision changes or painful eye movements. No fevers or chills.   The history is provided by the patient. No language interpreter was used.  Eye Pain       Home Medications Prior to Admission medications   Medication Sig Start Date End Date Taking? Authorizing Provider  escitalopram  (LEXAPRO ) 10 MG tablet Take 1 tablet (10 mg total) by mouth daily. 03/27/23   Roise Cleaver, MD      Allergies    Patient has no known allergies.    Review of Systems   Review of Systems  Eyes:  Positive for pain and redness.  All other systems reviewed and are negative.   Physical Exam Updated Vital Signs BP (!) 142/81 (BP Location: Right Arm)   Pulse 69   Temp 97.7 F (36.5 C) (Oral)   Resp 18   SpO2 100%  Physical Exam Vitals and nursing note reviewed.  Constitutional:      General: He is not in acute distress.    Appearance: Normal appearance. He is normal weight. He is not ill-appearing, toxic-appearing or diaphoretic.  HENT:     Head: Normocephalic and atraumatic.  Eyes:     General: Lids are normal. Lids are everted, no foreign bodies appreciated. Vision grossly intact.        Left eye: No foreign body, discharge or hordeolum.     Intraocular pressure: Left eye pressure is 18 mmHg.     Extraocular Movements: Extraocular movements intact.     Comments: Left conjunctiva with mild erythema without drainage, proptosis, chemosis. EOMs intact without pain. Fluorescin without uptake   Cardiovascular:     Rate and Rhythm: Normal rate.  Pulmonary:     Effort: Pulmonary effort is normal. No respiratory distress.  Musculoskeletal:        General: Normal range of motion.     Cervical back: Normal range of motion.  Skin:    General: Skin is warm and dry.  Neurological:     General: No focal deficit present.     Mental Status: He is alert.  Psychiatric:        Mood and Affect: Mood normal.        Behavior: Behavior normal.     ED Results / Procedures / Treatments   Labs (all labs ordered are listed, but only abnormal results are displayed) Labs Reviewed - No data to display  EKG None  Radiology No results found.  Procedures Procedures    Medications Ordered in ED Medications  fluorescein  ophthalmic strip 1 strip (1 strip Left Eye Given 12/30/23 0936)  tetracaine  (PONTOCAINE) 0.5 % ophthalmic solution 2 drop (2 drops Left Eye Given 12/30/23 4696)    ED Course/ Medical Decision Making/ A&P                                 Medical Decision Making Risk Prescription drug management.  Pt presents today with complaints of left eye foreign body sensation since weed-eating yesterday. He is afebrile, non-toxic appearing, and in no acute distress with reassuring vital signs. Physical exam reveals conjunctival erythema with vision grossly intact without sign of foreign body presence.  No proptosis.  Tono-Pen WNL. Eye irrigated w NS, no evidence of FB.  No change in vision, acuity equal bilaterally.  Pt is not a contact lens wearer.  Exam non-concerning for orbital cellulitis, hyphema, corneal ulcers. Patient will be discharged home with erythromycin  ointment. Given same in the emergency department today. Patient understands to follow up with ophthalmology, referral given for same & to return to ER if new symptoms develop including change in vision, purulent drainage, or entrapment. Evaluation and diagnostic testing in the emergency department does not suggest an  emergent condition requiring admission or immediate intervention beyond what has been performed at this time.  Plan for discharge with close PCP follow-up.  Patient is understanding and amenable with plan, educated on red flag symptoms that would prompt immediate return.  Patient discharged in stable condition.  Final Clinical Impression(s) / ED Diagnoses Final diagnoses:  Left eye pain    Rx / DC Orders ED Discharge Orders     None     An After Visit Summary was printed and given to the patient.     Clarence Cogswell A, PA-C 12/30/23 1030    Owen Blowers P, DO 12/30/23 1429

## 2023-12-30 NOTE — Discharge Instructions (Signed)
 As we discussed, your workup in the ER today was overall reassuring for acute findings.  We do not see anything in your eye today.,  However given your description of symptoms, we have provided you with antibiotic ointment for you to apply in your eye 4 times a day as you are able.  You should also see an ophthalmologist for follow-up.  I have given you a referral with a number to call to schedule appointment.  Please do so tomorrow.  Return if development of any new or worsening symptoms.

## 2023-12-30 NOTE — ED Notes (Signed)
 Visual Acuity:  20/20 right Eye  20/25 left eye 20/20 both eyes  --no corrective lenses
